# Patient Record
Sex: Female | Born: 1965 | Race: Black or African American | Hispanic: No | Marital: Single | Smoking: Never smoker
Health system: Southern US, Community
[De-identification: ages and names within clinical notes are randomized; demographics above are authoritative.]

## PROBLEM LIST (undated history)

## (undated) DIAGNOSIS — IMO0002 Reserved for concepts with insufficient information to code with codable children: Secondary | ICD-10-CM

## (undated) DIAGNOSIS — R42 Dizziness and giddiness: Secondary | ICD-10-CM

## (undated) DIAGNOSIS — I10 Essential (primary) hypertension: Secondary | ICD-10-CM

## (undated) DIAGNOSIS — J45909 Unspecified asthma, uncomplicated: Secondary | ICD-10-CM

## (undated) HISTORY — PX: ABDOMINAL HYSTERECTOMY: SHX81

## (undated) HISTORY — DX: Essential (primary) hypertension: I10

## (undated) HISTORY — PX: APPENDECTOMY: SHX54

## (undated) HISTORY — PX: CHOLECYSTECTOMY: SHX55

---

## 1999-08-12 ENCOUNTER — Emergency Department (HOSPITAL_COMMUNITY): Admission: EM | Admit: 1999-08-12 | Discharge: 1999-08-12 | Payer: Self-pay | Admitting: Emergency Medicine

## 2000-05-24 ENCOUNTER — Emergency Department (HOSPITAL_COMMUNITY): Admission: EM | Admit: 2000-05-24 | Discharge: 2000-05-24 | Payer: Self-pay | Admitting: Internal Medicine

## 2000-05-24 ENCOUNTER — Encounter: Payer: Self-pay | Admitting: Internal Medicine

## 2001-02-06 ENCOUNTER — Emergency Department (HOSPITAL_COMMUNITY): Admission: EM | Admit: 2001-02-06 | Discharge: 2001-02-06 | Payer: Self-pay | Admitting: Emergency Medicine

## 2001-09-17 ENCOUNTER — Emergency Department (HOSPITAL_COMMUNITY): Admission: EM | Admit: 2001-09-17 | Discharge: 2001-09-17 | Payer: Self-pay | Admitting: Emergency Medicine

## 2001-09-17 ENCOUNTER — Encounter: Payer: Self-pay | Admitting: Emergency Medicine

## 2006-05-12 ENCOUNTER — Emergency Department (HOSPITAL_COMMUNITY): Admission: EM | Admit: 2006-05-12 | Discharge: 2006-05-12 | Payer: Self-pay | Admitting: Emergency Medicine

## 2007-06-24 ENCOUNTER — Emergency Department (HOSPITAL_COMMUNITY): Admission: EM | Admit: 2007-06-24 | Discharge: 2007-06-25 | Payer: Self-pay | Admitting: Emergency Medicine

## 2009-04-26 ENCOUNTER — Emergency Department (HOSPITAL_BASED_OUTPATIENT_CLINIC_OR_DEPARTMENT_OTHER): Admission: EM | Admit: 2009-04-26 | Discharge: 2009-04-26 | Payer: Self-pay | Admitting: Emergency Medicine

## 2009-09-05 ENCOUNTER — Emergency Department (HOSPITAL_BASED_OUTPATIENT_CLINIC_OR_DEPARTMENT_OTHER): Admission: EM | Admit: 2009-09-05 | Discharge: 2009-09-06 | Payer: Self-pay | Admitting: Emergency Medicine

## 2009-09-05 ENCOUNTER — Ambulatory Visit: Payer: Self-pay | Admitting: Diagnostic Radiology

## 2009-10-07 ENCOUNTER — Ambulatory Visit: Payer: Self-pay | Admitting: Diagnostic Radiology

## 2009-10-07 ENCOUNTER — Emergency Department (HOSPITAL_BASED_OUTPATIENT_CLINIC_OR_DEPARTMENT_OTHER): Admission: EM | Admit: 2009-10-07 | Discharge: 2009-10-07 | Payer: Self-pay | Admitting: Emergency Medicine

## 2009-12-22 ENCOUNTER — Ambulatory Visit: Payer: Self-pay | Admitting: Diagnostic Radiology

## 2009-12-22 ENCOUNTER — Emergency Department (HOSPITAL_BASED_OUTPATIENT_CLINIC_OR_DEPARTMENT_OTHER): Admission: EM | Admit: 2009-12-22 | Discharge: 2009-12-22 | Payer: Self-pay | Admitting: Emergency Medicine

## 2010-05-01 ENCOUNTER — Ambulatory Visit: Payer: Self-pay | Admitting: Radiology

## 2010-05-01 ENCOUNTER — Emergency Department (HOSPITAL_BASED_OUTPATIENT_CLINIC_OR_DEPARTMENT_OTHER): Admission: EM | Admit: 2010-05-01 | Discharge: 2010-05-01 | Payer: Self-pay | Admitting: Emergency Medicine

## 2010-07-29 ENCOUNTER — Emergency Department (HOSPITAL_BASED_OUTPATIENT_CLINIC_OR_DEPARTMENT_OTHER): Admission: EM | Admit: 2010-07-29 | Discharge: 2010-07-29 | Payer: Self-pay | Admitting: Emergency Medicine

## 2010-09-03 ENCOUNTER — Ambulatory Visit: Payer: Self-pay | Admitting: Interventional Radiology

## 2010-09-03 ENCOUNTER — Ambulatory Visit: Payer: Self-pay | Admitting: Cardiovascular Disease

## 2010-09-03 ENCOUNTER — Encounter: Payer: Self-pay | Admitting: Emergency Medicine

## 2010-09-03 ENCOUNTER — Observation Stay (HOSPITAL_COMMUNITY): Admission: AD | Admit: 2010-09-03 | Discharge: 2010-09-05 | Payer: Self-pay | Admitting: Internal Medicine

## 2010-09-05 ENCOUNTER — Encounter (INDEPENDENT_AMBULATORY_CARE_PROVIDER_SITE_OTHER): Payer: Self-pay | Admitting: Internal Medicine

## 2010-09-09 ENCOUNTER — Encounter: Admission: RE | Admit: 2010-09-09 | Discharge: 2010-09-27 | Payer: Self-pay | Admitting: Internal Medicine

## 2010-09-14 ENCOUNTER — Telehealth (INDEPENDENT_AMBULATORY_CARE_PROVIDER_SITE_OTHER): Payer: Self-pay | Admitting: *Deleted

## 2010-09-15 ENCOUNTER — Encounter (HOSPITAL_COMMUNITY)
Admission: RE | Admit: 2010-09-15 | Discharge: 2010-11-13 | Payer: Self-pay | Source: Home / Self Care | Attending: Cardiovascular Disease | Admitting: Cardiovascular Disease

## 2010-09-15 ENCOUNTER — Ambulatory Visit: Payer: Self-pay | Admitting: Cardiology

## 2010-09-15 ENCOUNTER — Ambulatory Visit: Payer: Self-pay

## 2010-09-15 ENCOUNTER — Encounter: Payer: Self-pay | Admitting: Cardiology

## 2010-10-09 ENCOUNTER — Emergency Department (HOSPITAL_BASED_OUTPATIENT_CLINIC_OR_DEPARTMENT_OTHER): Admission: EM | Admit: 2010-10-09 | Discharge: 2010-10-09 | Payer: Self-pay | Admitting: Emergency Medicine

## 2010-11-13 ENCOUNTER — Emergency Department (HOSPITAL_BASED_OUTPATIENT_CLINIC_OR_DEPARTMENT_OTHER)
Admission: EM | Admit: 2010-11-13 | Discharge: 2010-11-13 | Payer: Self-pay | Source: Home / Self Care | Admitting: Emergency Medicine

## 2010-12-03 ENCOUNTER — Emergency Department (HOSPITAL_BASED_OUTPATIENT_CLINIC_OR_DEPARTMENT_OTHER)
Admission: EM | Admit: 2010-12-03 | Discharge: 2010-12-03 | Payer: Self-pay | Source: Home / Self Care | Admitting: Emergency Medicine

## 2010-12-14 NOTE — Assessment & Plan Note (Signed)
Summary: Cardiology Nuclear Testing  Nuclear Med Background Indications for Stress Test: Evaluation for Ischemia, Post Hospital  Indications Comments: 09/03/10 [facility] CP/Dizziness (-) enzymes  History: Echo  History Comments: 09/05/10 ECHO EF 65-70%   Symptoms: Chest Pain, Dizziness    Nuclear Pre-Procedure Cardiac Risk Factors: Hypertension Caffeine/Decaff Intake: None NPO After: 10:00 PM Lungs: clear IV 0.9% NS with Angio Cath: 24g     IV Site: R Hand IV Started by: Bonnita Levan, RN Chest Size (in) 44     Cup Size DD     Height (in): 62 Weight (lb): 187 BMI: 34.33  Nuclear Med Study 1 or 2 day study:  1 day     Stress Test Type:  Stress Reading MD:  Marca Ancona, MD     Referring MD:  C.McAlhany Resting Radionuclide:  Technetium 50m Tetrofosmin     Resting Radionuclide Dose:  10.8 mCi  Stress Radionuclide:  Technetium 75m Tetrofosmin     Stress Radionuclide Dose:  33 mCi   Stress Protocol Exercise Time (min):  8:46 min     Max HR:  146 bpm     Predicted Max HR:  175 bpm  Max Systolic BP: 185 mm Hg     Percent Max HR:  83.43 %     METS: 10.00 Rate Pressure Product:  60454    Stress Test Technologist:  Milana Na, EMT-P     Nuclear Technologist:  Doyne Keel, CNMT  Rest Procedure  Myocardial perfusion imaging was performed at rest 45 minutes following the intravenous administration of Technetium 27m Tetrofosmin.  Stress Procedure  The patient exercised for 8:46. The patient stopped due to fatigue and denied any chest pain.  There were no significant ST-T wave changes.  Technetium 84m Tetrofosmin was injected at peak exercise and myocardial perfusion imaging was performed after a brief delay.  QPS Raw Data Images:  Normal; no motion artifact; normal heart/lung ratio. Stress Images:  Normal homogeneous uptake in all areas of the myocardium. Rest Images:  Normal homogeneous uptake in all areas of the myocardium. Subtraction (SDS):  There is no evidence of scar or  ischemia. Transient Ischemic Dilatation:  .82  (Normal <1.22)  Lung/Heart Ratio:  .33  (Normal <0.45)  Quantitative Gated Spect Images QGS EDV:  56 ml QGS ESV:  13 ml QGS EF:  76 % QGS cine images:  Normal wall motion.    Overall Impression  Exercise Capacity: Fair exercise capacity. BP Response: Normal blood pressure response. Clinical Symptoms: No chest pain, stopped due to fatigue.  ECG Impression: No significant ST segment change suggestive of ischemia. Overall Impression: Normal stress nuclear study.  Appended Document: Cardiology Nuclear Testing Normal stress. Can we call her? thanks, cdm  Appended Document: Cardiology Nuclear Testing Pt. aware of normal stress test.

## 2010-12-14 NOTE — Progress Notes (Signed)
Summary: Nuclear Pre-Procedure  Phone Note Outgoing Call   Call placed by: Milana Na, EMT-P,  September 14, 2010 3:09 PM Summary of Call: Left message with information on [employer] Information Sheet (see scanned document for details).      Nuclear Med Background Indications for Stress Test: Evaluation for Ischemia, Post Hospital  Indications Comments: 09/03/10 [facility] CP/Dizziness (-) enzymes  History: Echo  History Comments: 09/05/10 ECHO EF 65-70%   Symptoms: Chest Pain, Dizziness    Nuclear Pre-Procedure Cardiac Risk Factors: Hypertension  Nuclear Med Study Referring MD:  C.McAlhany

## 2010-12-23 ENCOUNTER — Emergency Department (INDEPENDENT_AMBULATORY_CARE_PROVIDER_SITE_OTHER): Payer: BC Managed Care – PPO

## 2010-12-23 ENCOUNTER — Emergency Department (HOSPITAL_BASED_OUTPATIENT_CLINIC_OR_DEPARTMENT_OTHER)
Admission: EM | Admit: 2010-12-23 | Discharge: 2010-12-23 | Disposition: A | Discharge status: Home / Self Care | Payer: BC Managed Care – PPO | Attending: Emergency Medicine | Admitting: Emergency Medicine

## 2010-12-23 DIAGNOSIS — IMO0002 Reserved for concepts with insufficient information to code with codable children: Secondary | ICD-10-CM | POA: Insufficient documentation

## 2010-12-23 DIAGNOSIS — M25529 Pain in unspecified elbow: Secondary | ICD-10-CM

## 2010-12-23 DIAGNOSIS — G8929 Other chronic pain: Secondary | ICD-10-CM | POA: Insufficient documentation

## 2010-12-23 DIAGNOSIS — M25429 Effusion, unspecified elbow: Secondary | ICD-10-CM | POA: Insufficient documentation

## 2010-12-23 DIAGNOSIS — I10 Essential (primary) hypertension: Secondary | ICD-10-CM | POA: Insufficient documentation

## 2010-12-29 ENCOUNTER — Ambulatory Visit (INDEPENDENT_AMBULATORY_CARE_PROVIDER_SITE_OTHER): Payer: BC Managed Care – PPO | Admitting: Family Medicine

## 2010-12-29 ENCOUNTER — Encounter: Payer: Self-pay | Admitting: Family Medicine

## 2010-12-29 DIAGNOSIS — M549 Dorsalgia, unspecified: Secondary | ICD-10-CM | POA: Insufficient documentation

## 2010-12-29 DIAGNOSIS — M25529 Pain in unspecified elbow: Secondary | ICD-10-CM

## 2010-12-29 DIAGNOSIS — G43909 Migraine, unspecified, not intractable, without status migrainosus: Secondary | ICD-10-CM | POA: Insufficient documentation

## 2010-12-29 DIAGNOSIS — I10 Essential (primary) hypertension: Secondary | ICD-10-CM | POA: Insufficient documentation

## 2011-01-03 ENCOUNTER — Other Ambulatory Visit: Payer: Self-pay | Admitting: Family Medicine

## 2011-01-03 ENCOUNTER — Ambulatory Visit (INDEPENDENT_AMBULATORY_CARE_PROVIDER_SITE_OTHER): Payer: BC Managed Care – PPO | Admitting: Family Medicine

## 2011-01-03 ENCOUNTER — Ambulatory Visit (INDEPENDENT_AMBULATORY_CARE_PROVIDER_SITE_OTHER)
Admission: RE | Admit: 2011-01-03 | Discharge: 2011-01-03 | Disposition: A | Discharge status: Home / Self Care | Payer: BC Managed Care – PPO | Source: Ambulatory Visit | Attending: Family Medicine | Admitting: Family Medicine

## 2011-01-03 ENCOUNTER — Encounter: Payer: Self-pay | Admitting: Family Medicine

## 2011-01-03 ENCOUNTER — Ambulatory Visit (HOSPITAL_BASED_OUTPATIENT_CLINIC_OR_DEPARTMENT_OTHER)
Admission: RE | Admit: 2011-01-03 | Discharge: 2011-01-03 | Disposition: A | Discharge status: Home / Self Care | Payer: BC Managed Care – PPO | Source: Ambulatory Visit | Attending: Family Medicine | Admitting: Family Medicine

## 2011-01-03 DIAGNOSIS — M25521 Pain in right elbow: Secondary | ICD-10-CM

## 2011-01-03 DIAGNOSIS — M25529 Pain in unspecified elbow: Secondary | ICD-10-CM

## 2011-01-03 DIAGNOSIS — M25429 Effusion, unspecified elbow: Secondary | ICD-10-CM | POA: Insufficient documentation

## 2011-01-03 DIAGNOSIS — R52 Pain, unspecified: Secondary | ICD-10-CM

## 2011-01-05 ENCOUNTER — Ambulatory Visit (HOSPITAL_BASED_OUTPATIENT_CLINIC_OR_DEPARTMENT_OTHER)
Admission: RE | Admit: 2011-01-05 | Discharge: 2011-01-05 | Disposition: A | Discharge status: Home / Self Care | Payer: BC Managed Care – PPO | Source: Ambulatory Visit | Attending: Family Medicine | Admitting: Family Medicine

## 2011-01-05 DIAGNOSIS — R52 Pain, unspecified: Secondary | ICD-10-CM

## 2011-01-05 NOTE — Assessment & Plan Note (Signed)
Summary: ED F/U,NP,RT ARM/ELBOW FRACTURE,MC   Vital Signs:  Patient profile:   45 year old female Height:      62 inches (157.48 cm) Weight:      191.0 pounds (86.82 kg) BMI:     35.06 Temp:     98.0 degrees F (36.67 degrees C) oral Pulse rate:   93 / minute BP sitting:   128 / 85  (left arm)  Vitals Entered By: Baxter Hire) (December 29, 2010 10:49 AM) CC: ED f/u, right arm/elbow fracture Pain Assessment Patient in pain? yes     Location: rt. arm Intensity: 8 Onset of pain  Pain x 6 days Nutritional Status BMI of > 30 = obese  Does patient need assistance? Functional Status Self care Ambulation Normal   CC:  ED f/u and right arm/elbow fracture.  History of Present Illness: Patient here for ED f/u of right elbow probable fracture, effusion 6 days after initial x-rays.  Reviewed x-rays and shown to patient - discussed typically like to repeat x-rays at 10 days and no earlier than 1 week - posterior splint replaced on patient and wrapped up - she will come back on monday, remove splint and repeat x-rays of elbow to reassess for location of fracture.  out of work in the meantime - did not need refill on pain medication.  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

## 2011-01-10 ENCOUNTER — Ambulatory Visit: Payer: BC Managed Care – PPO | Attending: Family Medicine | Admitting: Physical Therapy

## 2011-01-11 NOTE — Assessment & Plan Note (Signed)
Summary: FOLLOW RIGHT ARM   Vital Signs:  Patient profile:   45 year old female Height:      62 inches (157.48 cm) Weight:      193.2 pounds (87.82 kg) BMI:     35.46 Temp:     98.1 degrees F (36.72 degrees C) oral Pulse (ortho):   111 / minute BP sitting:   127 / 85  (left arm)  Vitals Entered By: Baxter Hire) (January 03, 2011 10:19 AM) CC: follow-up visit / right arm Pain Assessment Patient in pain? yes     Location: right arm Intensity: 9 Nutritional Status BMI of > 30 = obese  Does patient need assistance? Functional Status Self care Ambulation Normal   CC:  follow-up visit / right arm.  History of Present Illness: Patient returns today for f/u R elbow effusion, possible fracture  Initial injury 10 days ago when hit right elbow on something at nursing facility. Over next 1-2 days, severe stiffness and pain with any motions of right wrist and elbow. Right hand dominant. Has been out of work since. X-rays in ED done showed effusion but no obvious fracture. Has been in posterior splint and here for repeat x-rays to assess for fracture. Using sling as well. Has pain medication - taking this as needed. No other complaints.  Habits & Providers  Alcohol-Tobacco-Diet     Alcohol drinks/day: 0     Tobacco Status: never  Problems Prior to Update: 1)  Essential Hypertension, Benign  (ICD-401.1) 2)  Migraine Headache  (ICD-346.90) 3)  Back Pain  (ICD-724.5) 4)  Elbow Pain, Right  (ICD-719.42)  Medications Prior to Update: 1)  Topamax 25 Mg Tabs (Topiramate) 2)  Lisinopril-Hydrochlorothiazide 20-12.5 Mg Tabs (Lisinopril-Hydrochlorothiazide) 3)  Flexeril 10 Mg Tabs (Cyclobenzaprine Hcl) 4)  Percocet 5-325 Mg Tabs (Oxycodone-Acetaminophen)  Allergies (verified): No Known Drug Allergies  Family History: Reviewed history from 12/29/2010 and no changes required. + DM and HTN in father, HTN in mother negative for heart disease  Social History: Reviewed  history from 12/29/2010 and no changes required. 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 TTP radial aspect of elbow within extensor mass.  No focal bony TTP at olecranon, radial head but diffuse posterior TTP. Pain with extension - lacks 15 degrees extension.  Full flexion.  Pain with supination as well. Collateral ligaments intact with valgus/varus stressing.  No reproduction of pain with these. NVI distally with 2+ radial pulse.   Impression & Recommendations:  Problem # 1:  ELBOW PAIN, RIGHT (ICD-719.42) Assessment Unchanged Repeat x-rays with improvement in elbow effusion but still not able to locate a fracture.  Therefore proceeded with CT scan to further assess given her limited ROM and pain today but lack of evidence of septic joint - CT without evidence of fracture as well.  Dx with elbow sprain vs synovitis.  Discontinue posterior splint.  Start PT to work on ROM and strengthening with hopes that she will be able to return to work over next few weeks.  NSAIDs regularly for pain, inflammation and swelling.  Icing but advised not to place over the area of ulnar nerve about elbow.  Elevation, ACE wrap as needed.  F/u in 2 -3 weeks for reevaluation and call with any questions in meantime.  Orders: Diagnostic X-Ray/Fluoroscopy (Diagnostic X-Ray/Flu) CT  without Contrast (CT w/o contrast)  Complete Medication List: 1)  Topamax 25 Mg Tabs (Topiramate) 2)  Lisinopril-hydrochlorothiazide 20-12.5 Mg Tabs (Lisinopril-hydrochlorothiazide) 3)  Flexeril 10 Mg Tabs (Cyclobenzaprine hcl) 4)  Percocet 5-325 Mg Tabs (Oxycodone-acetaminophen)   Orders Added: 1)  Diagnostic X-Ray/Fluoroscopy [Diagnostic X-Ray/Flu] 2)  CT without Contrast [CT w/o contrast] 3)   Est. Patient Level III [81191]

## 2011-01-17 ENCOUNTER — Encounter: Payer: Self-pay | Admitting: Family Medicine

## 2011-01-17 ENCOUNTER — Ambulatory Visit (INDEPENDENT_AMBULATORY_CARE_PROVIDER_SITE_OTHER): Payer: BC Managed Care – PPO | Admitting: Family Medicine

## 2011-01-17 DIAGNOSIS — M25529 Pain in unspecified elbow: Secondary | ICD-10-CM

## 2011-01-19 ENCOUNTER — Ambulatory Visit: Payer: BC Managed Care – PPO | Attending: Family Medicine | Admitting: Physical Therapy

## 2011-01-19 DIAGNOSIS — R42 Dizziness and giddiness: Secondary | ICD-10-CM | POA: Insufficient documentation

## 2011-01-19 DIAGNOSIS — IMO0001 Reserved for inherently not codable concepts without codable children: Secondary | ICD-10-CM | POA: Insufficient documentation

## 2011-01-19 DIAGNOSIS — R269 Unspecified abnormalities of gait and mobility: Secondary | ICD-10-CM | POA: Insufficient documentation

## 2011-01-25 LAB — URINALYSIS, ROUTINE W REFLEX MICROSCOPIC
Glucose, UA: NEGATIVE mg/dL
Leukocytes, UA: NEGATIVE
Urobilinogen, UA: 1 mg/dL (ref 0.0–1.0)
pH: 7.5 (ref 5.0–8.0)

## 2011-01-25 NOTE — Assessment & Plan Note (Signed)
Summary: F/U/LP   Vital Signs:  Patient profile:   45 year old female Height:      62 inches (157.48 cm) Weight:      194.2 pounds (88.27 kg) BMI:     35.65 Temp:     98.2 degrees F (36.78 degrees C) oral Pulse rate:   102 / minute BP sitting:   132 / 85  (left arm)  Vitals Entered By: Baxter Hire) (January 17, 2011 9:05 AM) CC: follow-up visit / right shoulder Pain Assessment Patient in pain? yes     Location: shoulder Intensity: 8 Onset of pain  pain worse this AM (01-17-11) Nutritional Status BMI of > 30 = obese  Does patient need assistance? Functional Status Self care Ambulation Normal   CC:  follow-up visit / right shoulder.  History of Present Illness: Patient returns today for f/u R elbow effusion, possible fracture  Initial injury 3 1/2 weeks ago when hit right elbow on something at nursing facility. Over next 1-2 days, severe stiffness and pain with any motions of right wrist and elbow. Right hand dominant. Has been out of work since. X-rays in ED done showed effusion but no obvious fracture. Has been in posterior splint and here for repeat x-rays to assess for fracture. At last OV repeat x-rays did not show fracture so CT done - no evidence of occult fracture. Was enrolled in PT but missed first appointment - due to go for first time this Wednesday. Using sling when out of house she says - otherwise leaving this off Heat, celebrex help. Some improvement but still stiff. Has pain medication - taking this as needed. No other complaints.  Habits & Providers  Alcohol-Tobacco-Diet     Alcohol drinks/day: 0     Tobacco Status: never  Current Problems (verified): 1)  Essential Hypertension, Benign  (ICD-401.1) 2)  Migraine Headache  (ICD-346.90) 3)  Back Pain  (ICD-724.5) 4)  Elbow Pain, Right  (ICD-719.42)  Current Medications (verified): 1)  Topamax 25 Mg Tabs (Topiramate) 2)  Lisinopril-Hydrochlorothiazide 20-12.5 Mg Tabs  (Lisinopril-Hydrochlorothiazide) 3)  Flexeril 10 Mg Tabs (Cyclobenzaprine Hcl) 4)  Percocet 5-325 Mg Tabs (Oxycodone-Acetaminophen)  Allergies (verified): No Known Drug Allergies  Social History: Reviewed history from 12/29/2010 and no changes required. 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 TTP Throughout radial and ulnar aspects of elbow within musculature.  No focal bony TTP at olecranon, radial head but diffuse posterior TTP. Pain with extension - lacks only 5 degrees extension now.  Full flexion.  Pain with supination as well. Collateral ligaments intact with valgus/varus stressing.  No reproduction of pain with these. NVI distally with 2+ radial pulse.   Impression & Recommendations:  Problem # 1:  ELBOW PAIN, RIGHT (ICD-719.42) Assessment Improved Some improvement in ROM and pain but she has not started PT as we had ordered last visit 2 weeks ago - she missed her first visit.  Stressed the importance of this and she must not miss another visit.  Otherwise she will not improve.  Reviewed x-ray and CT results again not showing a fracture - persistent pain and soreness now related to stiffness, muscle soreness.  Imperative she work on home exercise program as directed by PT as well.  Discontinue sling - discussed how this contributes to soreness and stiffness.  Continue heat, celebrex.  Complete Medication List: 1)  Topamax 25 Mg Tabs (Topiramate) 2)  Lisinopril-hydrochlorothiazide 20-12.5 Mg Tabs (Lisinopril-hydrochlorothiazide) 3)  Flexeril 10 Mg Tabs (Cyclobenzaprine hcl) 4)  Percocet 5-325 Mg Tabs (Oxycodone-acetaminophen)   Orders Added: 1)  Est. Patient Level II [16109]

## 2011-01-26 ENCOUNTER — Ambulatory Visit: Payer: BC Managed Care – PPO | Admitting: Physical Therapy

## 2011-01-26 LAB — DIFFERENTIAL
Basophils Relative: 1 % (ref 0–1)
Eosinophils Absolute: 0.1 10*3/uL (ref 0.0–0.7)
Lymphocytes Relative: 43 % (ref 12–46)
Lymphs Abs: 1.9 10*3/uL (ref 0.7–4.0)
Neutro Abs: 2.1 10*3/uL (ref 1.7–7.7)
Neutrophils Relative %: 48 % (ref 43–77)

## 2011-01-26 LAB — URINALYSIS, ROUTINE W REFLEX MICROSCOPIC
Bilirubin Urine: NEGATIVE
Glucose, UA: NEGATIVE mg/dL
Ketones, ur: NEGATIVE mg/dL
Nitrite: NEGATIVE
Urobilinogen, UA: 0.2 mg/dL (ref 0.0–1.0)
pH: 7.5 (ref 5.0–8.0)

## 2011-01-26 LAB — CBC
Hemoglobin: 12.7 g/dL (ref 12.0–15.0)
MCH: 29.9 pg (ref 26.0–34.0)
MCHC: 33.3 g/dL (ref 30.0–36.0)
MCV: 89.8 fL (ref 78.0–100.0)
RDW: 13.1 % (ref 11.5–15.5)
WBC: 4.4 10*3/uL (ref 4.0–10.5)

## 2011-01-26 LAB — CARDIAC PANEL(CRET KIN+CKTOT+MB+TROPI)
CK, MB: 1.7 ng/mL (ref 0.3–4.0)
Relative Index: 0.8 (ref 0.0–2.5)
Relative Index: 0.9 (ref 0.0–2.5)
Total CK: 164 U/L (ref 7–177)
Troponin I: 0.02 ng/mL (ref 0.00–0.06)
Troponin I: 0.02 ng/mL (ref 0.00–0.06)

## 2011-01-26 LAB — POCT CARDIAC MARKERS
CKMB, poc: <1 ng/mL — ABNORMAL LOW (ref 1.0–8.0)
Myoglobin, poc: 51.3 ng/mL (ref 12–200)
Troponin i, poc: <0.05 ng/mL (ref 0.00–0.09)

## 2011-01-26 LAB — HEMOGLOBIN A1C: Hgb A1c MFr Bld: 6 % — ABNORMAL HIGH (ref <5.7)

## 2011-01-26 LAB — LIPID PANEL
Cholesterol: 161 mg/dL (ref 0–200)
HDL: 48 mg/dL (ref >39)

## 2011-01-26 LAB — BASIC METABOLIC PANEL
CO2: 24 mEq/L (ref 19–32)
Chloride: 109 mEq/L (ref 96–112)

## 2011-01-30 LAB — COMPREHENSIVE METABOLIC PANEL
Alkaline Phosphatase: 137 U/L — ABNORMAL HIGH (ref 39–117)
GFR calc non Af Amer: >60 mL/min (ref >60)
Potassium: 3.6 mEq/L (ref 3.5–5.1)
Total Protein: 7.7 g/dL (ref 6.0–8.3)

## 2011-01-30 LAB — DIFFERENTIAL
Basophils Absolute: 0 10*3/uL (ref 0.0–0.1)
Basophils Relative: 1 % (ref 0–1)
Eosinophils Relative: 1 % (ref 0–5)
Lymphocytes Relative: 47 % — ABNORMAL HIGH (ref 12–46)
Monocytes Absolute: 0.6 10*3/uL (ref 0.1–1.0)
Neutro Abs: 2.9 10*3/uL (ref 1.7–7.7)
Neutrophils Relative %: 43 % (ref 43–77)

## 2011-01-30 LAB — URINALYSIS, ROUTINE W REFLEX MICROSCOPIC
Glucose, UA: NEGATIVE mg/dL
Ketones, ur: NEGATIVE mg/dL
Nitrite: NEGATIVE
Protein, ur: NEGATIVE mg/dL
Specific Gravity, Urine: 1.028 (ref 1.005–1.030)

## 2011-01-30 LAB — CBC
Hemoglobin: 12.7 g/dL (ref 12.0–15.0)
RBC: 4.38 MIL/uL (ref 3.87–5.11)
WBC: 6.8 10*3/uL (ref 4.0–10.5)

## 2011-01-30 LAB — URINE CULTURE: Culture: NO GROWTH

## 2011-01-31 ENCOUNTER — Ambulatory Visit (INDEPENDENT_AMBULATORY_CARE_PROVIDER_SITE_OTHER): Payer: BC Managed Care – PPO | Admitting: Family Medicine

## 2011-01-31 ENCOUNTER — Encounter: Payer: Self-pay | Admitting: Family Medicine

## 2011-01-31 DIAGNOSIS — M25529 Pain in unspecified elbow: Secondary | ICD-10-CM

## 2011-02-01 ENCOUNTER — Ambulatory Visit: Payer: BC Managed Care – PPO | Admitting: Physical Therapy

## 2011-02-03 ENCOUNTER — Encounter: Payer: BC Managed Care – PPO | Admitting: Physical Therapy

## 2011-02-10 NOTE — Letter (Signed)
Summary: Generic Letter  [facility]  [address]   [place], [place] [address]   Phone: [phone]  Fax:     01/31/2011  Veronica Braun [address] [place], [place]  [address]  [place]  To Whom It May Concern:  [name] may return to work on 3/26 with the following restrictions:  No lifting more than 15 pounds.    We will see her back in the clinic in 2 weeks for a reassessment of her clinical progress.  She will continue physical therapy in the meantime.   Sincerely,   Norton Blizzard MD

## 2011-02-10 NOTE — Assessment & Plan Note (Signed)
Summary: F/U/LP   Vital Signs:  Patient profile:   45 year old female Height:      62 inches (157.48 cm) Weight:      191.2 pounds (86.91 kg) BMI:     35.10 Temp:     98.6 degrees F (37.00 degrees C) oral Pulse rate:   101 / minute BP sitting:   135 / 89  (left arm)  Vitals Entered By: Baxter Hire) (January 31, 2011 9:31 AM) CC: follow-up visit Pain Assessment Patient in pain? yes     Location: right arm Intensity: 5 Nutritional Status BMI of > 30 = obese  Does patient need assistance? Functional Status Self care Ambulation Normal   CC:  follow-up visit.  History of Present Illness: Patient returns today for 2 wk f/u R elbow effusion, possible fracture  Initial injury 5 1/2 weeks ago when hit right elbow on something at nursing facility. Over next 1-2 days, severe stiffness and pain with any motions of right wrist and elbow. Right hand dominant. Has been out of work since. X-rays in ED done showed effusion but no obvious fracture. Had been in posterior splint and here for repeat x-rays to assess for fracture. Repeat x-rays and CT done - no evidence of occult fracture. Was enrolled in PT which she has been to over past 2 weeks with improvement. Stopped using sling 2 weeks ago. Heat, celebrex help. Has pain medication - taking this as needed. No other complaints. Has been placed on as needed status at work per her report.  Habits & Providers  Alcohol-Tobacco-Diet     Alcohol drinks/day: 0     Tobacco Status: never  Current Problems (verified): 1)  Essential Hypertension, Benign  (ICD-401.1) 2)  Migraine Headache  (ICD-346.90) 3)  Back Pain  (ICD-724.5) 4)  Elbow Pain, Right  (ICD-719.42)  Current Medications (verified): 1)  Topamax 25 Mg Tabs (Topiramate) 2)  Lisinopril-Hydrochlorothiazide 20-12.5 Mg Tabs (Lisinopril-Hydrochlorothiazide) 3)  Flexeril 10 Mg Tabs (Cyclobenzaprine Hcl) 4)  Percocet 5-325 Mg Tabs (Oxycodone-Acetaminophen)  Allergies  (verified): No Known Drug Allergies  Physical Exam  General:  Well-developed,well-nourished,in no acute distress; alert,appropriate and cooperative throughout examination Msk:  R elbow: No obvious deformity, swelling or bruising. No focal TTP. FROM without pain including supination/pronation though reports still having pain within elbow joint. Collateral ligaments intact with valgus/varus stressing.  No reproduction of pain with these. NVI distally with 2+ radial pulse. Strength 4+/5 with resisted flexion/extension of elbow.   Impression & Recommendations:  Problem # 1:  ELBOW PAIN, RIGHT (ICD-719.42) Assessment Improved Patient back to FROM but still with strength deficits.  Continue with PT and return to work next [employer] with only restriction being no lifting > 15 pounds.  Will see her in office 1 week later and anticipate full release at that time.  Exam much improved today.  Complete Medication List: 1)  Topamax 25 Mg Tabs (Topiramate) 2)  Lisinopril-hydrochlorothiazide 20-12.5 Mg Tabs (Lisinopril-hydrochlorothiazide) 3)  Flexeril 10 Mg Tabs (Cyclobenzaprine hcl) 4)  Percocet 5-325 Mg Tabs (Oxycodone-acetaminophen)  Patient Instructions: 1)  Continue with physical therapy - you are making excellent progress. 2)  No work this week but start next week with only restriction being no lifting more than 15 pounds.  If they don't have a job to accomodate this you will have to continue to be out. 3)  Follow up with [place] in 2 weeks. 4)  Anticipate complete return without restrictions at this time.   Orders Added: 1)  Est.  Patient Level III [id number]

## 2011-02-14 ENCOUNTER — Ambulatory Visit (INDEPENDENT_AMBULATORY_CARE_PROVIDER_SITE_OTHER): Payer: Self-pay | Admitting: Family Medicine

## 2011-02-14 ENCOUNTER — Encounter: Payer: Self-pay | Admitting: Family Medicine

## 2011-02-14 VITALS — BP 122/83 | HR 111 | Temp 98.3°F | Ht 63.0 in | Wt 190.0 lb

## 2011-02-14 DIAGNOSIS — M25529 Pain in unspecified elbow: Secondary | ICD-10-CM

## 2011-02-14 NOTE — Progress Notes (Signed)
  Subjective:    Patient ID: Veronica Braun, female    DOB: September 16, 1966, 45 y.o.   MRN: [id number]  HPI  Patient returns today for f/u R elbow pain, swelling from direct blow sustained on 2/9 at work. Initial injury when hit right elbow on something at nursing facility.  Over next 1-2 days, severe stiffness and pain with any motions of right wrist and elbow.  Right hand dominant.  Had been out of work since then placed in prn status per her report. X-rays in ED done showed effusion but no obvious fracture.  Had been in posterior splint and here for repeat x-rays to assess for fracture.  Repeat x-rays and CT done - no evidence of occult fracture.  Was enrolled in PT - she reports having only been to 1 session total - 2 deaths in family and birth of grandchild per her report have led her to cancel appointments. Has pain medication - taking this as needed though not currently in pain. No other complaints.   Past Medical History  Diagnosis Date  . Hypertension   . Migraine     No current outpatient prescriptions on file prior to visit.    No past surgical history on file.  No Known Allergies  History   Social History  . Marital Status: Single    Spouse Name: N/A    Number of Children: N/A  . Years of Education: N/A   Occupational History  . Not on file.   Social History Main Topics  . Smoking status: Never Smoker   . Smokeless tobacco: Not on file  . Alcohol Use: Not on file  . Drug Use: Not on file  . Sexually Active: Not on file   Other Topics Concern  . Not on file   Social History Narrative  . No narrative on file    Family History  Problem Relation Age of Onset  . Hypertension Mother   . Diabetes Father   . Hypertension Father   . Heart attack Neg Hx     BP 122/83  Pulse 111  Temp(Src) 98.3 F (36.8 C) (Oral)  Ht 5\' 3"  (1.6 m)  Wt 190 lb (86.183 kg)  BMI 33.66 kg/m2  Review of Systems See HPI above.    Objective:   Physical Exam General:  Well-developed,well-nourished,in no acute distress; alert,appropriate and cooperative throughout examination  Msk: R elbow:  No obvious deformity, swelling or bruising.  No focal TTP.  FROM without pain including supination/pronation.  Collateral ligaments intact with valgus/varus stressing. No reproduction of pain with these.  NVI distally with 2+ radial pulse.  Strength 5-/5 with resisted flexion/extension of elbow.     Assessment & Plan:  1. Right elbow pain.  Clinically healed.  FROM and only mild strength deficit - enough to return to work without any restrictions.  Work note provided.

## 2011-02-14 NOTE — Patient Instructions (Signed)
Return to work without restrictions - note given.

## 2011-02-14 NOTE — Assessment & Plan Note (Signed)
Clinically healed.  FROM and only mild strength deficit - enough to return to work without any restrictions.  Work note provided.

## 2011-02-16 LAB — DIFFERENTIAL
Basophils Relative: 1 % (ref 0–1)
Eosinophils Absolute: 0.1 10*3/uL (ref 0.0–0.7)
Eosinophils Relative: 1 % (ref 0–5)
Lymphs Abs: 3.8 10*3/uL (ref 0.7–4.0)
Neutrophils Relative %: 51 % (ref 43–77)

## 2011-02-16 LAB — CBC
HCT: 41.6 % (ref 36.0–46.0)
MCHC: 33.1 g/dL (ref 30.0–36.0)
MCV: 88.8 fL (ref 78.0–100.0)
Platelets: 257 10*3/uL (ref 150–400)
RDW: 12.8 % (ref 11.5–15.5)
WBC: 9.3 10*3/uL (ref 4.0–10.5)

## 2011-02-16 LAB — BASIC METABOLIC PANEL
BUN: 16 mg/dL (ref 6–23)
Creatinine, Ser: 1.2 mg/dL (ref 0.4–1.2)
Potassium: 3.6 mEq/L (ref 3.5–5.1)

## 2011-02-16 LAB — POCT CARDIAC MARKERS: Troponin i, poc: <0.05 ng/mL (ref 0.00–0.09)

## 2011-02-17 LAB — URINALYSIS, ROUTINE W REFLEX MICROSCOPIC
Ketones, ur: NEGATIVE mg/dL
Nitrite: NEGATIVE
Protein, ur: NEGATIVE mg/dL
pH: 6 (ref 5.0–8.0)

## 2011-02-17 LAB — URINE MICROSCOPIC-ADD ON

## 2011-02-17 LAB — COMPREHENSIVE METABOLIC PANEL
Alkaline Phosphatase: 151 U/L — ABNORMAL HIGH (ref 39–117)
BUN: 12 mg/dL (ref 6–23)
Chloride: 101 mEq/L (ref 96–112)
GFR calc non Af Amer: >60 mL/min (ref >60)
Glucose, Bld: 91 mg/dL (ref 70–99)
Potassium: 3.8 mEq/L (ref 3.5–5.1)
Total Bilirubin: 0.9 mg/dL (ref 0.3–1.2)

## 2011-02-17 LAB — LIPASE, BLOOD: Lipase: 103 U/L (ref 23–300)

## 2011-09-22 ENCOUNTER — Emergency Department (INDEPENDENT_AMBULATORY_CARE_PROVIDER_SITE_OTHER): Payer: Self-pay

## 2011-09-22 ENCOUNTER — Emergency Department (HOSPITAL_BASED_OUTPATIENT_CLINIC_OR_DEPARTMENT_OTHER)
Admission: EM | Admit: 2011-09-22 | Discharge: 2011-09-22 | Disposition: A | Discharge status: Home / Self Care | Payer: Self-pay | Attending: Emergency Medicine | Admitting: Emergency Medicine

## 2011-09-22 ENCOUNTER — Other Ambulatory Visit: Payer: Self-pay

## 2011-09-22 ENCOUNTER — Encounter (HOSPITAL_BASED_OUTPATIENT_CLINIC_OR_DEPARTMENT_OTHER): Payer: Self-pay | Admitting: *Deleted

## 2011-09-22 ENCOUNTER — Emergency Department (HOSPITAL_BASED_OUTPATIENT_CLINIC_OR_DEPARTMENT_OTHER): Payer: Self-pay

## 2011-09-22 DIAGNOSIS — I10 Essential (primary) hypertension: Secondary | ICD-10-CM | POA: Insufficient documentation

## 2011-09-22 DIAGNOSIS — J40 Bronchitis, not specified as acute or chronic: Secondary | ICD-10-CM | POA: Insufficient documentation

## 2011-09-22 DIAGNOSIS — R05 Cough: Secondary | ICD-10-CM

## 2011-09-22 MED ORDER — AZITHROMYCIN 250 MG PO TABS: 250.0000 mg | ORAL_TABLET | Freq: Every day | ORAL | Status: AC

## 2011-09-22 MED ORDER — ALBUTEROL SULFATE HFA 108 (90 BASE) MCG/ACT IN AERS: 2.0000 | INHALATION_SPRAY | RESPIRATORY_TRACT | Status: DC | PRN

## 2011-09-22 NOTE — ED Provider Notes (Signed)
History     CSN: [id number] Arrival date & time: 09/22/2011 11:16 AM   First MD Initiated Contact with Patient 09/22/11 1129      Chief Complaint  Patient presents with  . Cough    (Consider location/radiation/quality/duration/timing/severity/associated sxs/prior treatment) HPI Comments: Patient complains of productive cough and congestion for the past 4 days. She describes her mucous is yellow and green. She states her front of her chest and her back are sore from coughing. She denies any fever, abdominal pain.   She has had sick contacts with her grandchildren., no history of asthma and does not smoke. She's had several episodes of posttussive emesis and pain in her chest from coughing. She denies any emesis not associated with coughing.  The history is provided by the patient.    Past Medical History  Diagnosis Date  . Hypertension   . Migraine     Past Surgical History  Procedure Date  . Cholecystectomy   . Abdominal hysterectomy   . Appendectomy     Family History  Problem Relation Age of Onset  . Hypertension Mother   . Diabetes Father   . Hypertension Father   . Heart attack Neg Hx     History  Substance Use Topics  . Smoking status: Never Smoker   . Smokeless tobacco: Not on file  . Alcohol Use: No    OB History    Grav Para Term Preterm Abortions TAB SAB Ect Mult Living                  Review of Systems  Constitutional: Positive for fatigue. Negative for fever, activity change and appetite change.  HENT: Positive for congestion, sore throat and rhinorrhea. Negative for trouble swallowing.   Eyes: Negative for discharge.  Respiratory: Positive for cough.   Cardiovascular: Positive for chest pain.  Gastrointestinal: Positive for nausea and vomiting. Negative for abdominal pain.  Genitourinary: Positive for hematuria. Negative for dysuria.  Musculoskeletal: Positive for back pain.  Neurological: Negative for weakness and headaches.    Allergies    Review of patient's allergies indicates no known allergies.  Home Medications   Current Outpatient Rx  Name Route Sig Dispense Refill  . ALBUTEROL SULFATE HFA 108 (90 BASE) MCG/ACT IN AERS Inhalation Inhale 2 puffs into the lungs every 4 (four) hours as needed for wheezing. 1 Inhaler 0  . AZITHROMYCIN 250 MG PO TABS Oral Take 1 tablet (250 mg total) by mouth daily. Take first 2 tablets together, then 1 every day until finished. 6 tablet 0    BP 139/83  Pulse 95  Temp(Src) 98.3 F (36.8 C) (Oral)  Resp 20  SpO2 99%  Physical Exam  Constitutional: She is oriented to person, place, and time. She appears well-developed and well-nourished. No distress.  HENT:  Head: Normocephalic and atraumatic.  Right Ear: External ear normal.  Left Ear: External ear normal.  Mouth/Throat: Oropharynx is clear and moist. No oropharyngeal exudate.  Eyes: Conjunctivae are normal. Pupils are equal, round, and reactive to light.  Neck: Normal range of motion. Neck supple.       No meningismus  Cardiovascular: Normal rate, regular rhythm and normal heart sounds.   Pulmonary/Chest: Effort normal and breath sounds normal. No respiratory distress. She has no wheezes. She exhibits tenderness.       Anterior chest wall tenderness  Abdominal: Soft. There is no tenderness. There is no rebound and no guarding.  Musculoskeletal: Normal range of motion. She exhibits no edema and  no tenderness.       Thoracic paraspinal tenderness  Neurological: She is alert and oriented to person, place, and time. No cranial nerve deficit.  Skin: Skin is warm.    ED Course  Procedures (including critical care time)  Labs Reviewed - No data to display Dg Chest 2 View  09/22/2011  *RADIOLOGY REPORT*  Clinical Data: Productive cough.  CHEST - 2 VIEW  Comparison: 09/04/2011  Findings: Two views of the chest demonstrate clear lungs. Heart and mediastinum are within normal limits.  The trachea is midline. Bony structures are  intact.  Clips in the right upper quadrant of the abdomen.  IMPRESSION: No acute chest findings.  Original Report Authenticated By: Richarda Overlie, M.D.     1. Bronchitis       MDM  Cough, congestion, subjective fever, posttussive emesis.  No respiratory distress, vitals stable.  Lungs clear. PERC negative.  Will treat for bronchitis with albuterol and zithromax.   Date: 09/22/2011  Rate: 94  Rhythm: normal sinus rhythm  QRS Axis: normal  Intervals: normal  ST/T Wave abnormalities: normal  Conduction Disutrbances:none  Narrative Interpretation:   Old EKG Reviewed: unchanged          Glynn Octave, MD 09/22/11 1252

## 2011-09-22 NOTE — ED Notes (Signed)
Productive cough x 4 days with yellow sputum. Coughs so hard at times that it makes her vomit. Worse with laying down. Chest and back are sore from coughing. States she has a bad taste in her mouth when she coughs. Denies fever. No hx of asthma or bronchitis.

## 2011-12-28 ENCOUNTER — Other Ambulatory Visit: Payer: Self-pay

## 2011-12-28 ENCOUNTER — Emergency Department (HOSPITAL_BASED_OUTPATIENT_CLINIC_OR_DEPARTMENT_OTHER)
Admission: EM | Admit: 2011-12-28 | Discharge: 2011-12-28 | Disposition: A | Discharge status: Home / Self Care | Payer: Self-pay | Attending: Emergency Medicine | Admitting: Emergency Medicine

## 2011-12-28 ENCOUNTER — Emergency Department (INDEPENDENT_AMBULATORY_CARE_PROVIDER_SITE_OTHER): Payer: Self-pay

## 2011-12-28 ENCOUNTER — Encounter (HOSPITAL_BASED_OUTPATIENT_CLINIC_OR_DEPARTMENT_OTHER): Payer: Self-pay | Admitting: *Deleted

## 2011-12-28 DIAGNOSIS — R079 Chest pain, unspecified: Secondary | ICD-10-CM

## 2011-12-28 DIAGNOSIS — R059 Cough, unspecified: Secondary | ICD-10-CM | POA: Insufficient documentation

## 2011-12-28 DIAGNOSIS — J45909 Unspecified asthma, uncomplicated: Secondary | ICD-10-CM

## 2011-12-28 DIAGNOSIS — R05 Cough: Secondary | ICD-10-CM

## 2011-12-28 DIAGNOSIS — J45901 Unspecified asthma with (acute) exacerbation: Secondary | ICD-10-CM | POA: Insufficient documentation

## 2011-12-28 DIAGNOSIS — R0602 Shortness of breath: Secondary | ICD-10-CM

## 2011-12-28 DIAGNOSIS — I10 Essential (primary) hypertension: Secondary | ICD-10-CM | POA: Insufficient documentation

## 2011-12-28 MED ORDER — PREDNISONE 50 MG PO TABS
60.0000 mg | ORAL_TABLET | Freq: Once | ORAL | Status: AC
  Administered 2011-12-28: 60 mg via ORAL
  Filled 2011-12-28: qty 1

## 2011-12-28 MED ORDER — ALBUTEROL SULFATE HFA 108 (90 BASE) MCG/ACT IN AERS
2.0000 | INHALATION_SPRAY | RESPIRATORY_TRACT | Status: DC | PRN
  Administered 2011-12-28: 2 via RESPIRATORY_TRACT
  Filled 2011-12-28: qty 6.7

## 2011-12-28 MED ORDER — PREDNISONE 10 MG PO TABS: 20.0000 mg | ORAL_TABLET | Freq: Every day | ORAL | Status: DC

## 2011-12-28 NOTE — ED Provider Notes (Addendum)
History     CSN: [id number]  Arrival date & time 12/28/11  5366   First MD Initiated Contact with Patient 12/28/11 2044      Chief Complaint  Patient presents with  . Cough    (Consider location/radiation/quality/duration/timing/severity/associated sxs/prior treatment) HPI Patient with cough for 6 days.  Symptoms began with sinusitis.  No fever, then in chest.  Tight across chest with coughing of clear sputum.  Patient with albuterol for similar episode earlier this year.  Patient with some dyspnea today.    Past Medical History  Diagnosis Date  . Hypertension   . Migraine     Past Surgical History  Procedure Date  . Cholecystectomy   . Abdominal hysterectomy   . Appendectomy     Family History  Problem Relation Age of Onset  . Hypertension Mother   . Diabetes Father   . Hypertension Father   . Heart attack Neg Hx     History  Substance Use Topics  . Smoking status: Never Smoker   . Smokeless tobacco: Not on file  . Alcohol Use: No    OB History    Grav Para Term Preterm Abortions TAB SAB Ect Mult Living                  Review of Systems  All other systems reviewed and are negative.    Allergies  Review of patient's allergies indicates no known allergies.  Home Medications   Current Outpatient Rx  Name Route Sig Dispense Refill  . ALBUTEROL SULFATE HFA 108 (90 BASE) MCG/ACT IN AERS Inhalation Inhale 2 puffs into the lungs every 4 (four) hours as needed for wheezing. 1 Inhaler 0  . B COMPLEX PO TABS Oral Take 1 tablet by mouth daily.    . ENALAPRIL MALEATE 5 MG PO TABS Oral Take 5 mg by mouth daily.    . ADULT MULTIVITAMIN W/MINERALS CH Oral Take 1 tablet by mouth daily.    . TOPIRAMATE 100 MG PO TABS Oral Take 100 mg by mouth 2 (two) times daily.      BP 156/82  Pulse 94  Temp(Src) 98.2 F (36.8 C) (Oral)  SpO2 99%  Physical Exam  Vitals reviewed. Constitutional: She is oriented to person, place, and time. She appears well-developed and  well-nourished.  HENT:  Head: Normocephalic and atraumatic.  Right Ear: External ear normal.  Left Ear: External ear normal.  Mouth/Throat: Oropharynx is clear and moist.  Eyes: Conjunctivae and EOM are normal. Pupils are equal, round, and reactive to light.  Neck: Normal range of motion. Neck supple.  Cardiovascular: Normal rate, regular rhythm and normal heart sounds.   Pulmonary/Chest: Effort normal. She has wheezes.  Abdominal: Soft. Bowel sounds are normal.  Musculoskeletal: Normal range of motion.  Neurological: She is alert and oriented to person, place, and time.  Skin: Skin is warm and dry.  Psychiatric: She has a normal mood and affect.    ED Course  Procedures (including critical care time)  Labs Reviewed - No data to display Dg Chest 2 View  12/28/2011  *RADIOLOGY REPORT*  Clinical Data: Chest pain.  Shortness of breath.  CHEST - 2 VIEW  Comparison: Two-view chest 09/22/2011.  Findings: The heart size is normal.  The lungs are clear.  The visualized soft tissues and bony thorax are unremarkable.  IMPRESSION: Negative chest.  Original Report Authenticated By: Jamesetta Orleans. MATTERN, M.D.     No diagnosis found.  Date: 12/28/2011  Rate: 90  Rhythm:  normal sinus rhythm  QRS Axis: normal  Intervals: normal  ST/T Wave abnormalities: normal  Conduction Disutrbances:none  Narrative Interpretation:   Old EKG Reviewed: none available     MDM          Hilario Quarry, MD 12/28/11 2100  Hilario Quarry, MD 12/28/11 2108

## 2011-12-28 NOTE — Discharge Instructions (Signed)

## 2011-12-28 NOTE — ED Notes (Signed)
Patient states she has been coughing for 2 weeks. Now having "tightness" in her chest and "trouble breathing".

## 2011-12-28 NOTE — ED Notes (Signed)
Pt has been sick with some nasal drainage last week and reports having a nonproductive cough. Pt is not in any respiratory distress.

## 2012-08-16 ENCOUNTER — Emergency Department (HOSPITAL_BASED_OUTPATIENT_CLINIC_OR_DEPARTMENT_OTHER): Payer: Self-pay

## 2012-08-16 ENCOUNTER — Emergency Department (HOSPITAL_BASED_OUTPATIENT_CLINIC_OR_DEPARTMENT_OTHER)
Admission: EM | Admit: 2012-08-16 | Discharge: 2012-08-16 | Disposition: A | Discharge status: Home / Self Care | Payer: Self-pay | Attending: Emergency Medicine | Admitting: Emergency Medicine

## 2012-08-16 ENCOUNTER — Encounter (HOSPITAL_BASED_OUTPATIENT_CLINIC_OR_DEPARTMENT_OTHER): Payer: Self-pay | Admitting: *Deleted

## 2012-08-16 DIAGNOSIS — R5381 Other malaise: Secondary | ICD-10-CM | POA: Insufficient documentation

## 2012-08-16 DIAGNOSIS — R0602 Shortness of breath: Secondary | ICD-10-CM | POA: Insufficient documentation

## 2012-08-16 DIAGNOSIS — R61 Generalized hyperhidrosis: Secondary | ICD-10-CM | POA: Insufficient documentation

## 2012-08-16 DIAGNOSIS — Z79899 Other long term (current) drug therapy: Secondary | ICD-10-CM | POA: Insufficient documentation

## 2012-08-16 DIAGNOSIS — I10 Essential (primary) hypertension: Secondary | ICD-10-CM | POA: Insufficient documentation

## 2012-08-16 DIAGNOSIS — R42 Dizziness and giddiness: Secondary | ICD-10-CM | POA: Insufficient documentation

## 2012-08-16 DIAGNOSIS — R110 Nausea: Secondary | ICD-10-CM | POA: Insufficient documentation

## 2012-08-16 DIAGNOSIS — R079 Chest pain, unspecified: Secondary | ICD-10-CM | POA: Insufficient documentation

## 2012-08-16 LAB — BASIC METABOLIC PANEL
Chloride: 98 mEq/L (ref 96–112)
Creatinine, Ser: 0.7 mg/dL (ref 0.50–1.10)
GFR calc Af Amer: >90 mL/min (ref >90)
Potassium: 3.8 mEq/L (ref 3.5–5.1)
Sodium: 137 mEq/L (ref 135–145)

## 2012-08-16 LAB — URINALYSIS, ROUTINE W REFLEX MICROSCOPIC
Bilirubin Urine: NEGATIVE
Nitrite: NEGATIVE
Specific Gravity, Urine: 1.016 (ref 1.005–1.030)
Urobilinogen, UA: 1 mg/dL (ref 0.0–1.0)

## 2012-08-16 LAB — CBC
Platelets: 272 10*3/uL (ref 150–400)
RDW: 13.9 % (ref 11.5–15.5)
WBC: 6.2 10*3/uL (ref 4.0–10.5)

## 2012-08-16 LAB — TROPONIN I: Troponin I: <0.3 ng/mL (ref <0.30)

## 2012-08-16 LAB — D-DIMER, QUANTITATIVE: D-Dimer, Quant: 0.32 ug/mL-FEU (ref 0.00–0.48)

## 2012-08-16 MED ORDER — GI COCKTAIL ~~LOC~~
30.0000 mL | Freq: Once | ORAL | Status: AC
  Administered 2012-08-16: 30 mL via ORAL
  Filled 2012-08-16: qty 30

## 2012-08-16 MED ORDER — ACETAMINOPHEN-CODEINE #3 300-30 MG PO TABS: 1.0000 | ORAL_TABLET | Freq: Four times a day (QID) | ORAL | Status: DC | PRN

## 2012-08-16 MED ORDER — PANTOPRAZOLE SODIUM 20 MG PO TBEC: 20.0000 mg | DELAYED_RELEASE_TABLET | Freq: Every day | ORAL | Status: DC

## 2012-08-16 MED ORDER — ONDANSETRON HCL 4 MG/2ML IJ SOLN
4.0000 mg | Freq: Once | INTRAMUSCULAR | Status: AC
  Administered 2012-08-16: 4 mg via INTRAVENOUS
  Filled 2012-08-16: qty 2

## 2012-08-16 MED ORDER — MORPHINE SULFATE 4 MG/ML IJ SOLN
4.0000 mg | Freq: Once | INTRAMUSCULAR | Status: AC
  Administered 2012-08-16: 4 mg via INTRAVENOUS
  Filled 2012-08-16: qty 1

## 2012-08-16 NOTE — ED Notes (Signed)
2-3 days of intermittent chest pain across chest also having some shortness of breath

## 2012-08-16 NOTE — ED Provider Notes (Signed)
Medical screening examination/treatment/procedure(s) were conducted as a shared visit with non-physician practitioner(s) and myself.  I personally evaluated the patient during the encounter  Pt low risk for ACS.  No pain when she exerts herself.  Symptoms have been ongoing for days and she has negative cardiac markers and nl EKG. Low risk for PE with negative d dimer.  Will try antacids.  Follow up with PCP   Rate: 94  Rhythm: normal sinus rhythm  QRS Axis: normal  Intervals: normal  ST/T Wave abnormalities: normal  Conduction Disutrbances:none  Narrative Interpretation: nl  Old EKG Reviewed: no change  Celene Kras, MD 08/16/12 1454

## 2012-08-16 NOTE — ED Provider Notes (Signed)
History     CSN: [id number]  Arrival date & time 08/16/12  1223   First MD Initiated Contact with Patient 08/16/12 1233      Chief Complaint  Patient presents with  . Chest Pain  . Shortness of Breath    (Consider location/radiation/quality/duration/timing/severity/associated sxs/prior treatment) HPI Comments: 46 y/o female with HTN presents to ED with gradual onset chest pain 3 days ago worsening today at 12:00 pm while sitting at her desk at work. She cannot remember what she was doing at onset of chest pain, but it has been constant over the past few days. Today she describes her pain as if "someone is sitting on her chest", non-radiating, rated 9/10. She has not tried any alleviating factors. Never had chest pain like this before. Admits to associated nausea, diaphoresis, and sob. Shortness of breath is worse when laying down. States she feels lightheaded, dizzy, weak, and fatigued. Denies rashes, fever, chills, visual disturbance, confusion. Denies family hx of heart disease before age 40. She is a non smoker. Admits to having acid reflux but states this is nothing like her heartburn.  Patient is a 46 y.o. female presenting with chest pain and shortness of breath. The history is provided by the patient.  Chest Pain Primary symptoms include fatigue, shortness of breath, nausea and dizziness. Pertinent negatives for primary symptoms include no cough.  Dizziness also occurs with nausea, weakness and diaphoresis.   Associated symptoms include diaphoresis and weakness.    Shortness of Breath  Associated symptoms include chest pain and shortness of breath. Pertinent negatives include no cough.    Past Medical History  Diagnosis Date  . Hypertension   . Migraine     Past Surgical History  Procedure Date  . Cholecystectomy   . Abdominal hysterectomy   . Appendectomy     Family History  Problem Relation Age of Onset  . Hypertension Mother   . Diabetes Father   .  Hypertension Father   . Heart attack Neg Hx     History  Substance Use Topics  . Smoking status: Never Smoker   . Smokeless tobacco: Not on file  . Alcohol Use: No    OB History    Grav Para Term Preterm Abortions TAB SAB Ect Mult Living                  Review of Systems  Constitutional: Positive for diaphoresis and fatigue.  HENT: Negative for neck pain and neck stiffness.   Eyes: Negative for visual disturbance.  Respiratory: Positive for shortness of breath. Negative for cough.   Cardiovascular: Positive for chest pain. Negative for leg swelling.  Gastrointestinal: Positive for nausea.  Genitourinary: Negative for difficulty urinating.  Musculoskeletal: Negative for back pain.  Skin: Negative for color change and rash.  Neurological: Positive for dizziness, weakness and light-headedness. Negative for syncope.  Psychiatric/Behavioral: Negative for confusion.    Allergies  Review of patient's allergies indicates no known allergies.  Home Medications   Current Outpatient Rx  Name Route Sig Dispense Refill  . ALBUTEROL SULFATE HFA 108 (90 BASE) MCG/ACT IN AERS Inhalation Inhale 2 puffs into the lungs every 4 (four) hours as needed for wheezing. 1 Inhaler 0  . B COMPLEX PO TABS Oral Take 1 tablet by mouth daily.    . ENALAPRIL MALEATE 5 MG PO TABS Oral Take 5 mg by mouth daily.    . ADULT MULTIVITAMIN W/MINERALS CH Oral Take 1 tablet by mouth daily.    Marland Kitchen  PREDNISONE 10 MG PO TABS Oral Take 2 tablets (20 mg total) by mouth daily. 15 tablet 0  . TOPIRAMATE 100 MG PO TABS Oral Take 100 mg by mouth 2 (two) times daily.      BP 140/77  Pulse 99  Temp 97.6 F (36.4 C) (Oral)  Resp 20  Ht 5\' 2"  (1.575 m)  Wt 180 lb (81.647 kg)  BMI 32.92 kg/m2  SpO2 100%  Physical Exam  Nursing note and vitals reviewed. Constitutional: She is oriented to person, place, and time. She appears well-developed and well-nourished. No distress. Nasal cannula in place.  HENT:  Head:  Normocephalic and atraumatic.  Mouth/Throat: Oropharynx is clear and moist and mucous membranes are normal.  Eyes: Conjunctivae normal and EOM are normal. Pupils are equal, round, and reactive to light.  Neck: Normal range of motion. Neck supple. No JVD present.  Cardiovascular: Normal rate, regular rhythm, normal heart sounds and intact distal pulses.   Pulmonary/Chest: Breath sounds normal. No accessory muscle usage. No respiratory distress. She has no decreased breath sounds. She has no wheezes. She has no rhonchi. She has no rales.  Abdominal: Soft. Normal appearance and bowel sounds are normal.  Musculoskeletal: Normal range of motion.  Neurological: She is alert and oriented to person, place, and time.  Skin: Skin is warm and dry.  Psychiatric: She has a normal mood and affect. Her behavior is normal.    ED Course  Procedures (including critical care time)  Labs Reviewed - No data to display Results for orders placed during the hospital encounter of 08/16/12  CBC      Component Value Range   WBC 6.2  4.0 - 10.5 K/uL   RBC 4.71  3.87 - 5.11 MIL/uL   Hemoglobin 14.0  12.0 - 15.0 g/dL   HCT 16.1  09.6 - 04.5 %   MCV 87.0  78.0 - 100.0 fL   MCH 29.7  26.0 - 34.0 pg   MCHC 34.1  30.0 - 36.0 g/dL   RDW 40.9  81.1 - 91.4 %   Platelets 272  150 - 400 K/uL  BASIC METABOLIC PANEL      Component Value Range   Sodium 137  135 - 145 mEq/L   Potassium 3.8  3.5 - 5.1 mEq/L   Chloride 98  96 - 112 mEq/L   CO2 25  19 - 32 mEq/L   Glucose, Bld 84  70 - 99 mg/dL   BUN 11  6 - 23 mg/dL   Creatinine, Ser 7.82  0.50 - 1.10 mg/dL   Calcium 9.9  8.4 - 95.6 mg/dL   GFR calc non Af Amer >90  >90 mL/min   GFR calc Af Amer >90  >90 mL/min  TROPONIN I      Component Value Range   Troponin I <0.30  <0.30 ng/mL  URINALYSIS, ROUTINE W REFLEX MICROSCOPIC      Component Value Range   Color, Urine YELLOW  YELLOW   APPearance CLEAR  CLEAR   Specific Gravity, Urine 1.016  1.005 - 1.030   pH 6.5   5.0 - 8.0   Glucose, UA NEGATIVE  NEGATIVE mg/dL   Hgb urine dipstick NEGATIVE  NEGATIVE   Bilirubin Urine NEGATIVE  NEGATIVE   Ketones, ur NEGATIVE  NEGATIVE mg/dL   Protein, ur NEGATIVE  NEGATIVE mg/dL   Urobilinogen, UA 1.0  0.0 - 1.0 mg/dL   Nitrite NEGATIVE  NEGATIVE   Leukocytes, UA NEGATIVE  NEGATIVE  D-DIMER, QUANTITATIVE  Component Value Range   D-Dimer, Quant 0.32  0.00 - 0.48 ug/mL-FEU  TROPONIN I      Component Value Range   Troponin I <0.30  <0.30 ng/mL    Dg Chest 2 View  08/16/2012  *RADIOLOGY REPORT*  Clinical Data: Chest pain and shortness of breath for 3 days.  CHEST - 2 VIEW  Comparison: PA and lateral chest 12/28/2011.  Findings: Lungs are clear.  Heart size is normal.  No pneumothorax or pleural fluid.  IMPRESSION: Negative chest.   Original Report Authenticated By: Bernadene Bell. Maricela Curet, M.D.     Date: 08/16/2012  Rate: 94  Rhythm: normal sinus rhythm  QRS Axis: normal  Intervals: normal  ST/T Wave abnormalities: normal  Conduction Disutrbances:none  Narrative Interpretation: no stemi, normal EKG  Old EKG Reviewed: unchanged    1. Chest pain   2. Shortness of breath       MDM  46 y/o female with worsening chest pain around 12:00 today while sitting at her work desk. Patient asked to speak with me and asked if her pain could be due to stress. States she has been under increased stress over the past month, but nothing in specific has happened today or over the past few days.  3:59 PM No acute findings seen on labs, ekg, or xray. Her pain has decreased since receiving GI cocktail. Discharged with protonix and resource list for f/u with a PCP. Advised her to discuss anxiety with a PCP. Close return precautions discussed. No significant cardiac risk factors besides HTN. Case discussed with Dr. Lynelle Doctor who also evaluated patient and agrees with plan of care.       Trevor Mace, PA-C 08/16/12 [phone]

## 2012-10-08 ENCOUNTER — Encounter (HOSPITAL_BASED_OUTPATIENT_CLINIC_OR_DEPARTMENT_OTHER): Payer: Self-pay | Admitting: *Deleted

## 2012-10-08 ENCOUNTER — Emergency Department (HOSPITAL_BASED_OUTPATIENT_CLINIC_OR_DEPARTMENT_OTHER)
Admission: EM | Admit: 2012-10-08 | Discharge: 2012-10-08 | Disposition: A | Discharge status: Home / Self Care | Payer: Self-pay | Attending: Emergency Medicine | Admitting: Emergency Medicine

## 2012-10-08 DIAGNOSIS — Z79899 Other long term (current) drug therapy: Secondary | ICD-10-CM | POA: Insufficient documentation

## 2012-10-08 DIAGNOSIS — I10 Essential (primary) hypertension: Secondary | ICD-10-CM | POA: Insufficient documentation

## 2012-10-08 DIAGNOSIS — G43909 Migraine, unspecified, not intractable, without status migrainosus: Secondary | ICD-10-CM | POA: Insufficient documentation

## 2012-10-08 DIAGNOSIS — L089 Local infection of the skin and subcutaneous tissue, unspecified: Secondary | ICD-10-CM | POA: Insufficient documentation

## 2012-10-08 MED ORDER — HYDROCODONE-ACETAMINOPHEN 5-325 MG PO TABS: 2.0000 | ORAL_TABLET | ORAL | Status: DC | PRN

## 2012-10-08 MED ORDER — SULFAMETHOXAZOLE-TRIMETHOPRIM 800-160 MG PO TABS: 1.0000 | ORAL_TABLET | Freq: Two times a day (BID) | ORAL | Status: DC

## 2012-10-08 NOTE — ED Provider Notes (Signed)
History     CSN: [id number]  Arrival date & time 10/08/12  1818   First MD Initiated Contact with Patient 10/08/12 1841      Chief Complaint  Patient presents with  . Abscess    (Consider location/radiation/quality/duration/timing/severity/associated sxs/prior treatment) Patient is a 46 y.o. female presenting with abscess. The history is provided by the patient. No language interpreter was used.  Abscess  This is a new problem. Episode onset: 2 days ago. The onset was gradual. The problem occurs continuously. The problem has been gradually worsening. The abscess is present on the abdomen. The problem is moderate. The abscess is characterized by painfulness and redness. There were no sick contacts. She has received no recent medical care.  Pt complains of a painful swollen area to mid pubic area  Past Medical History  Diagnosis Date  . Hypertension   . Migraine     Past Surgical History  Procedure Date  . Cholecystectomy   . Abdominal hysterectomy   . Appendectomy     Family History  Problem Relation Age of Onset  . Hypertension Mother   . Diabetes Father   . Hypertension Father   . Heart attack Neg Hx     History  Substance Use Topics  . Smoking status: Never Smoker   . Smokeless tobacco: Not on file  . Alcohol Use: No    OB History    Grav Para Term Preterm Abortions TAB SAB Ect Mult Living                  Review of Systems  Skin: Positive for color change.  All other systems reviewed and are negative.    Allergies  Review of patient's allergies indicates no known allergies.  Home Medications   Current Outpatient Rx  Name  Route  Sig  Dispense  Refill  . ACETAMINOPHEN-CODEINE #3 300-30 MG PO TABS   Oral   Take 1-2 tablets by mouth every 6 (six) hours as needed for pain.   15 tablet   0   . ALBUTEROL SULFATE HFA 108 (90 BASE) MCG/ACT IN AERS   Inhalation   Inhale 2 puffs into the lungs every 4 (four) hours as needed for wheezing.   1  Inhaler   0   . B COMPLEX PO TABS   Oral   Take 1 tablet by mouth daily.         . ENALAPRIL MALEATE 5 MG PO TABS   Oral   Take 5 mg by mouth daily.         . ADULT MULTIVITAMIN W/MINERALS CH   Oral   Take 1 tablet by mouth daily.         Marland Kitchen PANTOPRAZOLE SODIUM 20 MG PO TBEC   Oral   Take 1 tablet (20 mg total) by mouth daily.   30 tablet   0   . PREDNISONE 10 MG PO TABS   Oral   Take 2 tablets (20 mg total) by mouth daily.   15 tablet   0   . TOPIRAMATE 100 MG PO TABS   Oral   Take 100 mg by mouth 2 (two) times daily.           BP 139/97  Pulse 81  Temp 98.3 F (36.8 C) (Oral)  Resp 20  Wt 183 lb 3 oz (83.093 kg)  SpO2 98%  Physical Exam  Nursing note and vitals reviewed. Constitutional: She is oriented to person, place, and time. She appears  well-developed and well-nourished.  HENT:  Head: Normocephalic.  Pulmonary/Chest: Effort normal.  Abdominal: Soft.  Musculoskeletal: She exhibits tenderness.  Neurological: She is alert and oriented to person, place, and time.  Skin: There is erythema.  Psychiatric: She has a normal mood and affect.  swollen area pubic area, no fluctuance  ED Course  Procedures (including critical care time)  Labs Reviewed - No data to display No results found.   No diagnosis found.    MDM  Pt advised continue soaking area,   Pt given rx for septra and hydrocodone for pain        Lonia Skinner [place], [place] 10/08/12 1908

## 2012-10-08 NOTE — ED Notes (Signed)
Chart reviewed.

## 2012-10-08 NOTE — ED Notes (Signed)
Abscess to her pubic region x 2 days.

## 2012-10-09 NOTE — ED Provider Notes (Signed)
Medical screening examination/treatment/procedure(s) were performed by non-physician practitioner and as supervising physician I was immediately available for consultation/collaboration.   Yaacov Koziol, MD 10/09/12 0011 

## 2012-10-13 ENCOUNTER — Emergency Department (HOSPITAL_BASED_OUTPATIENT_CLINIC_OR_DEPARTMENT_OTHER)
Admission: EM | Admit: 2012-10-13 | Discharge: 2012-10-13 | Disposition: A | Discharge status: Home / Self Care | Payer: Self-pay | Attending: Emergency Medicine | Admitting: Emergency Medicine

## 2012-10-13 ENCOUNTER — Encounter (HOSPITAL_BASED_OUTPATIENT_CLINIC_OR_DEPARTMENT_OTHER): Payer: Self-pay | Admitting: *Deleted

## 2012-10-13 DIAGNOSIS — IMO0002 Reserved for concepts with insufficient information to code with codable children: Secondary | ICD-10-CM | POA: Insufficient documentation

## 2012-10-13 DIAGNOSIS — L03319 Cellulitis of trunk, unspecified: Secondary | ICD-10-CM | POA: Insufficient documentation

## 2012-10-13 DIAGNOSIS — L02214 Cutaneous abscess of groin: Secondary | ICD-10-CM

## 2012-10-13 DIAGNOSIS — L02412 Cutaneous abscess of left axilla: Secondary | ICD-10-CM

## 2012-10-13 DIAGNOSIS — Z79899 Other long term (current) drug therapy: Secondary | ICD-10-CM | POA: Insufficient documentation

## 2012-10-13 DIAGNOSIS — I10 Essential (primary) hypertension: Secondary | ICD-10-CM | POA: Insufficient documentation

## 2012-10-13 DIAGNOSIS — L02219 Cutaneous abscess of trunk, unspecified: Secondary | ICD-10-CM | POA: Insufficient documentation

## 2012-10-13 DIAGNOSIS — Z8669 Personal history of other diseases of the nervous system and sense organs: Secondary | ICD-10-CM | POA: Insufficient documentation

## 2012-10-13 MED ORDER — HYDROCODONE-ACETAMINOPHEN 5-325 MG PO TABS
ORAL_TABLET | ORAL | Status: AC
  Filled 2012-10-13: qty 1

## 2012-10-13 MED ORDER — HYDROCODONE-ACETAMINOPHEN 5-325 MG PO TABS
1.0000 | ORAL_TABLET | Freq: Once | ORAL | Status: AC
  Administered 2012-10-13: 1 via ORAL

## 2012-10-13 MED ORDER — HYDROCODONE-ACETAMINOPHEN 5-325 MG PO TABS: 1.0000 | ORAL_TABLET | Freq: Four times a day (QID) | ORAL | Status: DC | PRN

## 2012-10-13 NOTE — ED Provider Notes (Signed)
History     CSN: [id number]  Arrival date & time 10/13/12  0447   First MD Initiated Contact with Patient 10/13/12 0500      Chief Complaint  Patient presents with  . Abscess    (Consider location/radiation/quality/duration/timing/severity/associated sxs/prior treatment) HPI This is a 46 year old female with a one-week history of an abscess on her mons pubis. She was seen here 5 days ago and treated with trimethoprim sulfamethoxazole. No I&D was done at that time. Since then the abscess is worsened and she no longer tolerate the pain. The pain is moderate to severe and worse with palpation or movement. She also has a smaller abscess that his arrhythmias and in her left axilla. There's been some purulent drainage from the abscess. She denies fever or chills. She has had some transient nausea but no vomiting.  Past Medical History  Diagnosis Date  . Hypertension   . Migraine     Past Surgical History  Procedure Date  . Cholecystectomy   . Abdominal hysterectomy   . Appendectomy     Family History  Problem Relation Age of Onset  . Hypertension Mother   . Diabetes Father   . Hypertension Father   . Heart attack Neg Hx     History  Substance Use Topics  . Smoking status: Never Smoker   . Smokeless tobacco: Not on file  . Alcohol Use: No    OB History    Grav Para Term Preterm Abortions TAB SAB Ect Mult Living                  Review of Systems  All other systems reviewed and are negative.    Allergies  Review of patient's allergies indicates no known allergies.  Home Medications   Current Outpatient Rx  Name  Route  Sig  Dispense  Refill  . ACETAMINOPHEN-CODEINE #3 300-30 MG PO TABS   Oral   Take 1-2 tablets by mouth every 6 (six) hours as needed for pain.   15 tablet   0   . ALBUTEROL SULFATE HFA 108 (90 BASE) MCG/ACT IN AERS   Inhalation   Inhale 2 puffs into the lungs every 4 (four) hours as needed for wheezing.   1 Inhaler   0   . B COMPLEX  PO TABS   Oral   Take 1 tablet by mouth daily.         . ENALAPRIL MALEATE 5 MG PO TABS   Oral   Take 5 mg by mouth daily.         Marland Kitchen HYDROCODONE-ACETAMINOPHEN 5-325 MG PO TABS   Oral   Take 2 tablets by mouth every 4 (four) hours as needed for pain.   10 tablet   0   . ADULT MULTIVITAMIN W/MINERALS CH   Oral   Take 1 tablet by mouth daily.         Marland Kitchen PANTOPRAZOLE SODIUM 20 MG PO TBEC   Oral   Take 1 tablet (20 mg total) by mouth daily.   30 tablet   0   . PREDNISONE 10 MG PO TABS   Oral   Take 2 tablets (20 mg total) by mouth daily.   15 tablet   0   . SULFAMETHOXAZOLE-TRIMETHOPRIM 800-160 MG PO TABS   Oral   Take 1 tablet by mouth 2 (two) times daily.   20 tablet   0   . TOPIRAMATE 100 MG PO TABS   Oral   Take 100 mg  by mouth 2 (two) times daily.           BP 138/80  Pulse 79  Temp 97.4 F (36.3 C) (Oral)  Resp 16  SpO2 98%  Physical Exam General: Well-developed, well-nourished female in no acute distress; appearance consistent with age of record HENT: normocephalic, atraumatic Eyes: Normal appearance Neck: supple Heart: regular rate and rhythm Lungs: Normal respiratory effort and excursion Abdomen: soft; nondistended Extremities: No deformity; full range of motion Neurologic: Awake, alert and oriented; motor function intact in all extremities and symmetric; no facial droop Skin: Warm and dry; abscess of mons pubis; pointing abscess of left axilla Psychiatric: Anxious    ED Course  Procedures (including critical care time)  INCISION AND DRAINAGE Performed by: Paula Libra L Consent: Verbal consent obtained. Risks and benefits: risks, benefits and alternatives were discussed Type: abscess  Body area: Right mons pubis  Anesthesia: local infiltration  Incision was made with a scalpel.  Local anesthetic: lidocaine 2 % with epinephrine  Anesthetic total: 2 ml  Complexity: complex Blunt dissection to break up  loculations  Drainage: purulent  Drainage amount: Small   Packing material: 1/4 in iodoform gauze  Patient tolerance: Patient tolerated the procedure well with no immediate complications.    INCISION AND DRAINAGE Performed by: Paula Libra L Consent: Verbal consent obtained. Risks and benefits: risks, benefits and alternatives were discussed Type: abscess  Body area: Left axilla  Anesthesia: local infiltration  Incision was made with a scalpel.  Local anesthetic: lidocaine 2 % with epinephrine  Anesthetic total: 1 ml  Complexity: Simple Blunt dissection to break up loculations  Drainage: purulent  Drainage amount: Small   Packing material: 1/4 in iodoform gauze  Patient tolerance: Patient tolerated the procedure well with no immediate complications.       MDM  Patient advised to remove packing in 48 hours. She was advised to return here should symptoms worsen instead of improve.        Hanley Seamen, MD 10/13/12 [phone]

## 2012-10-13 NOTE — ED Notes (Signed)
Pt was seen here on Monday and was told to return to ED in a few days pt was put on abx tx with no relief

## 2012-10-19 ENCOUNTER — Emergency Department (HOSPITAL_BASED_OUTPATIENT_CLINIC_OR_DEPARTMENT_OTHER)
Admission: EM | Admit: 2012-10-19 | Discharge: 2012-10-19 | Disposition: A | Discharge status: Home / Self Care | Payer: Self-pay | Attending: Emergency Medicine | Admitting: Emergency Medicine

## 2012-10-19 ENCOUNTER — Encounter (HOSPITAL_BASED_OUTPATIENT_CLINIC_OR_DEPARTMENT_OTHER): Payer: Self-pay | Admitting: *Deleted

## 2012-10-19 DIAGNOSIS — I10 Essential (primary) hypertension: Secondary | ICD-10-CM | POA: Insufficient documentation

## 2012-10-19 DIAGNOSIS — G43909 Migraine, unspecified, not intractable, without status migrainosus: Secondary | ICD-10-CM | POA: Insufficient documentation

## 2012-10-19 DIAGNOSIS — Z8679 Personal history of other diseases of the circulatory system: Secondary | ICD-10-CM | POA: Insufficient documentation

## 2012-10-19 DIAGNOSIS — Z4801 Encounter for change or removal of surgical wound dressing: Secondary | ICD-10-CM | POA: Insufficient documentation

## 2012-10-19 DIAGNOSIS — Z9889 Other specified postprocedural states: Secondary | ICD-10-CM | POA: Insufficient documentation

## 2012-10-19 DIAGNOSIS — Z79899 Other long term (current) drug therapy: Secondary | ICD-10-CM | POA: Insufficient documentation

## 2012-10-19 DIAGNOSIS — L0390 Cellulitis, unspecified: Secondary | ICD-10-CM

## 2012-10-19 DIAGNOSIS — Z5189 Encounter for other specified aftercare: Secondary | ICD-10-CM

## 2012-10-19 MED ORDER — CLINDAMYCIN HCL 300 MG PO CAPS: 300.0000 mg | ORAL_CAPSULE | Freq: Four times a day (QID) | ORAL | Status: DC

## 2012-10-19 NOTE — ED Notes (Signed)
Pt had groin abscess opened on Saturday- here for wound check

## 2012-10-19 NOTE — ED Provider Notes (Signed)
History     CSN: [id number]  Arrival date & time 10/19/12  1755   First MD Initiated Contact with Patient 10/19/12 1921      Chief Complaint  Patient presents with  . Wound Check    (Consider location/radiation/quality/duration/timing/severity/associated sxs/prior treatment) The history is provided by the patient.  Veronica Braun is a 46 y.o. female hx of HTN, migraine here with wound check. She had I&D performed a week ago and was told to remove packing 2 days later. She was placed on bactrim for the last week. She said that the wound hasn't been draining. However, she is still having pain around the site and it is still swollen. No fevers. No hx of MRSA.    Past Medical History  Diagnosis Date  . Hypertension   . Migraine     Past Surgical History  Procedure Date  . Cholecystectomy   . Abdominal hysterectomy   . Appendectomy     Family History  Problem Relation Age of Onset  . Hypertension Mother   . Diabetes Father   . Hypertension Father   . Heart attack Neg Hx     History  Substance Use Topics  . Smoking status: Never Smoker   . Smokeless tobacco: Never Used  . Alcohol Use: No    OB History    Grav Para Term Preterm Abortions TAB SAB Ect Mult Living                  Review of Systems  Skin: Positive for wound.  All other systems reviewed and are negative.    Allergies  Review of patient's allergies indicates no known allergies.  Home Medications   Current Outpatient Rx  Name  Route  Sig  Dispense  Refill  . B COMPLEX PO TABS   Oral   Take 1 tablet by mouth daily.         . ENALAPRIL MALEATE 5 MG PO TABS   Oral   Take 5 mg by mouth daily.         . ADULT MULTIVITAMIN W/MINERALS CH   Oral   Take 1 tablet by mouth daily.         Marland Kitchen PANTOPRAZOLE SODIUM 20 MG PO TBEC   Oral   Take 1 tablet (20 mg total) by mouth daily.   30 tablet   0   . SULFAMETHOXAZOLE-TRIMETHOPRIM 800-160 MG PO TABS   Oral   Take 1 tablet by mouth 2 (two)  times daily.   20 tablet   0   . TOPIRAMATE 100 MG PO TABS   Oral   Take 100 mg by mouth 2 (two) times daily.         . ACETAMINOPHEN-CODEINE #3 300-30 MG PO TABS   Oral   Take 1-2 tablets by mouth every 6 (six) hours as needed for pain.   15 tablet   0   . ALBUTEROL SULFATE HFA 108 (90 BASE) MCG/ACT IN AERS   Inhalation   Inhale 2 puffs into the lungs every 4 (four) hours as needed for wheezing.   1 Inhaler   0   . HYDROCODONE-ACETAMINOPHEN 5-325 MG PO TABS   Oral   Take 2 tablets by mouth every 4 (four) hours as needed for pain.   10 tablet   0   . HYDROCODONE-ACETAMINOPHEN 5-325 MG PO TABS   Oral   Take 1-2 tablets by mouth every 6 (six) hours as needed for pain.   10 tablet  0   . PREDNISONE 10 MG PO TABS   Oral   Take 2 tablets (20 mg total) by mouth daily.   15 tablet   0     BP 150/82  Pulse 75  Temp 98.2 F (36.8 C) (Oral)  Resp 18  Ht 5' 2.5" (1.588 m)  Wt 183 lb (83.008 kg)  BMI 32.94 kg/m2  SpO2 100%  Physical Exam  Nursing note and vitals reviewed. Constitutional: She is oriented to person, place, and time. She appears well-developed and well-nourished.  HENT:  Head: Normocephalic.  Mouth/Throat: Oropharynx is clear and moist.  Eyes: Conjunctivae normal are normal. Pupils are equal, round, and reactive to light.  Neck: Normal range of motion. Neck supple.  Cardiovascular: Normal rate.   Pulmonary/Chest: Effort normal.  Abdominal: Soft. Bowel sounds are normal.  Musculoskeletal: Normal range of motion.  Neurological: She is alert and oriented to person, place, and time.  Skin: Skin is warm and dry.       Around R groin area, there is a 1 in x 1 in area of mild cellulitis with firmness around it. No fluctuance. Mildly tender.   Psychiatric: She has a normal mood and affect. Her behavior is normal. Judgment and thought content normal.    ED Course  Procedures (including critical care time)  EMERGENCY DEPARTMENT US SOFT TISSUE  INTERPRETATION "Study: Limited Ultrasound of the noted body part in comments below"  INDICATIONS: Soft tissue infection Multiple views of the body part are obtained with a multi-frequency linear probe  PERFORMED BY:  Myself  IMAGES ARCHIVED?: Yes  SIDE:Right   BODY PART:Other soft tisse (comment in note)  FINDINGS: No abcess noted and Cellulitis present  LIMITATIONS:  Emergent Procedure  INTERPRETATION:  No abcess noted and Cellulitis present  COMMENT:  R groin area + celullitis, there is small collection but no obvious abscess.     Labs Reviewed - No data to display No results found.   No diagnosis found.    MDM  Veronica Braun is a 46 y.o. female here with cellulitis even with abx. She may have a small collection that is too small to drain. Will change to clindamycin and recommend f/u in a week for repeat wound check.          Richardean Canal, MD 10/19/12 [phone]

## 2012-12-06 ENCOUNTER — Emergency Department (HOSPITAL_BASED_OUTPATIENT_CLINIC_OR_DEPARTMENT_OTHER)
Admission: EM | Admit: 2012-12-06 | Discharge: 2012-12-06 | Disposition: A | Discharge status: Home / Self Care | Payer: Self-pay | Attending: Emergency Medicine | Admitting: Emergency Medicine

## 2012-12-06 ENCOUNTER — Encounter (HOSPITAL_BASED_OUTPATIENT_CLINIC_OR_DEPARTMENT_OTHER): Payer: Self-pay | Admitting: *Deleted

## 2012-12-06 DIAGNOSIS — I10 Essential (primary) hypertension: Secondary | ICD-10-CM | POA: Insufficient documentation

## 2012-12-06 DIAGNOSIS — J111 Influenza due to unidentified influenza virus with other respiratory manifestations: Secondary | ICD-10-CM

## 2012-12-06 DIAGNOSIS — J3489 Other specified disorders of nose and nasal sinuses: Secondary | ICD-10-CM | POA: Insufficient documentation

## 2012-12-06 DIAGNOSIS — Z8679 Personal history of other diseases of the circulatory system: Secondary | ICD-10-CM | POA: Insufficient documentation

## 2012-12-06 DIAGNOSIS — R059 Cough, unspecified: Secondary | ICD-10-CM | POA: Insufficient documentation

## 2012-12-06 DIAGNOSIS — Z79899 Other long term (current) drug therapy: Secondary | ICD-10-CM | POA: Insufficient documentation

## 2012-12-06 DIAGNOSIS — R112 Nausea with vomiting, unspecified: Secondary | ICD-10-CM | POA: Insufficient documentation

## 2012-12-06 DIAGNOSIS — R51 Headache: Secondary | ICD-10-CM | POA: Insufficient documentation

## 2012-12-06 DIAGNOSIS — R05 Cough: Secondary | ICD-10-CM | POA: Insufficient documentation

## 2012-12-06 MED ORDER — ONDANSETRON 8 MG PO TBDP
8.0000 mg | ORAL_TABLET | Freq: Once | ORAL | Status: AC
  Administered 2012-12-06: 8 mg via ORAL
  Filled 2012-12-06: qty 1

## 2012-12-06 MED ORDER — GUAIFENESIN-CODEINE 100-10 MG/5ML PO SYRP: 5.0000 mL | ORAL_SOLUTION | Freq: Three times a day (TID) | ORAL | Status: DC | PRN

## 2012-12-06 MED ORDER — PROMETHAZINE HCL 25 MG PO TABS: 25.0000 mg | ORAL_TABLET | Freq: Four times a day (QID) | ORAL | Status: DC | PRN

## 2012-12-06 MED ORDER — KETOROLAC TROMETHAMINE 60 MG/2ML IM SOLN
60.0000 mg | Freq: Once | INTRAMUSCULAR | Status: AC
  Administered 2012-12-06: 60 mg via INTRAMUSCULAR
  Filled 2012-12-06: qty 2

## 2012-12-06 NOTE — ED Notes (Signed)
Pt amb to room 2 with quick steady gait in nad.  Pt reports cough and congestion and nausea x 3 days. md at bedside for exam.

## 2012-12-06 NOTE — ED Notes (Signed)
Pt assisted to call for ride home. Pt requests to stay in room and rest, states her ride should be here within 15 minutes. Pt given warm blanket and call light.

## 2012-12-06 NOTE — ED Provider Notes (Signed)
History     CSN: [id number]  Arrival date & time 12/06/12  0920   First MD Initiated Contact with Patient 12/06/12 0930      Chief Complaint  Patient presents with  . Cough  . Nasal Congestion    (Consider location/radiation/quality/duration/timing/severity/associated sxs/prior treatment) HPI Comments: Patient presents to the ER for evaluation of headache (moderate throbbing), sinus congestion, cough, generalized body aches, malaise with nausea and vomiting. Symptoms began 3 days ago. She has used over-the-counter medication without improvement.  Patient is a 47 y.o. female presenting with cough.  Cough    Past Medical History  Diagnosis Date  . Hypertension   . Migraine     Past Surgical History  Procedure Date  . Cholecystectomy   . Abdominal hysterectomy   . Appendectomy     Family History  Problem Relation Age of Onset  . Hypertension Mother   . Diabetes Father   . Hypertension Father   . Heart attack Neg Hx     History  Substance Use Topics  . Smoking status: Never Smoker   . Smokeless tobacco: Never Used  . Alcohol Use: No    OB History    Grav Para Term Preterm Abortions TAB SAB Ect Mult Living                  Review of Systems  HENT: Positive for congestion.   Respiratory: Positive for cough.   Gastrointestinal: Positive for nausea and vomiting.  All other systems reviewed and are negative.    Allergies  Review of patient's allergies indicates no known allergies.  Home Medications   Current Outpatient Rx  Name  Route  Sig  Dispense  Refill  . ACETAMINOPHEN-CODEINE #3 300-30 MG PO TABS   Oral   Take 1-2 tablets by mouth every 6 (six) hours as needed for pain.   15 tablet   0   . ALBUTEROL SULFATE HFA 108 (90 BASE) MCG/ACT IN AERS   Inhalation   Inhale 2 puffs into the lungs every 4 (four) hours as needed for wheezing.   1 Inhaler   0   . B COMPLEX PO TABS   Oral   Take 1 tablet by mouth daily.         Marland Kitchen CLINDAMYCIN HCL  300 MG PO CAPS   Oral   Take 1 capsule (300 mg total) by mouth 4 (four) times daily. X 7 days   28 capsule   0   . ENALAPRIL MALEATE 5 MG PO TABS   Oral   Take 5 mg by mouth daily.         Marland Kitchen HYDROCODONE-ACETAMINOPHEN 5-325 MG PO TABS   Oral   Take 2 tablets by mouth every 4 (four) hours as needed for pain.   10 tablet   0   . HYDROCODONE-ACETAMINOPHEN 5-325 MG PO TABS   Oral   Take 1-2 tablets by mouth every 6 (six) hours as needed for pain.   10 tablet   0   . ADULT MULTIVITAMIN W/MINERALS CH   Oral   Take 1 tablet by mouth daily.         Marland Kitchen PANTOPRAZOLE SODIUM 20 MG PO TBEC   Oral   Take 1 tablet (20 mg total) by mouth daily.   30 tablet   0   . PREDNISONE 10 MG PO TABS   Oral   Take 2 tablets (20 mg total) by mouth daily.   15 tablet   0   .  SULFAMETHOXAZOLE-TRIMETHOPRIM 800-160 MG PO TABS   Oral   Take 1 tablet by mouth 2 (two) times daily.   20 tablet   0   . TOPIRAMATE 100 MG PO TABS   Oral   Take 100 mg by mouth 2 (two) times daily.           BP 144/88  Pulse 102  Temp 98.3 F (36.8 C) (Oral)  Resp 20  Ht 5\' 2"  (1.575 m)  Wt 180 lb (81.647 kg)  BMI 32.92 kg/m2  SpO2 97%  Physical Exam  Constitutional: She is oriented to person, place, and time. She appears well-developed and well-nourished. No distress.  HENT:  Head: Normocephalic and atraumatic.  Right Ear: Hearing normal.  Nose: Mucosal edema present. Right sinus exhibits no maxillary sinus tenderness and no frontal sinus tenderness. Left sinus exhibits no maxillary sinus tenderness and no frontal sinus tenderness.  Mouth/Throat: Oropharynx is clear and moist and mucous membranes are normal.  Eyes: Conjunctivae normal and EOM are normal. Pupils are equal, round, and reactive to light.  Neck: Normal range of motion. Neck supple.  Cardiovascular: Normal rate, regular rhythm, S1 normal and S2 normal.  Exam reveals no gallop and no friction rub.   No murmur heard. Pulmonary/Chest:  Effort normal and breath sounds normal. No respiratory distress. She exhibits no tenderness.  Abdominal: Soft. Normal appearance and bowel sounds are normal. There is no hepatosplenomegaly. There is no tenderness. There is no rebound, no guarding, no tenderness at McBurney's point and negative Murphy's sign. No hernia.  Musculoskeletal: Normal range of motion.  Neurological: She is alert and oriented to person, place, and time. She has normal strength. No cranial nerve deficit or sensory deficit. Coordination normal. GCS eye subscore is 4. GCS verbal subscore is 5. GCS motor subscore is 6.  Skin: Skin is warm, dry and intact. No rash noted. No cyanosis.  Psychiatric: She has a normal mood and affect. Her speech is normal and behavior is normal. Thought content normal.    ED Course  Procedures (including critical care time)  Labs Reviewed - No data to display No results found.   Diagnosis: Influenza    MDM  Patient presents to ER with complaints of flulike symptoms. She has had a cough but her lungs are clear and her oxygenation is normal. No clinical concern for pneumonia there is no bronchospasm. She has had nausea and vomiting but abdominal exam is benign. Symptoms are consistent with prevalent influenza in the community. Patient will be treated symptomatically.        Gilda Crease, MD 12/06/12 [phone]

## 2013-01-22 ENCOUNTER — Emergency Department (HOSPITAL_BASED_OUTPATIENT_CLINIC_OR_DEPARTMENT_OTHER)
Admission: EM | Admit: 2013-01-22 | Discharge: 2013-01-22 | Disposition: A | Discharge status: Home / Self Care | Payer: Self-pay | Attending: Emergency Medicine | Admitting: Emergency Medicine

## 2013-01-22 ENCOUNTER — Encounter (HOSPITAL_BASED_OUTPATIENT_CLINIC_OR_DEPARTMENT_OTHER): Payer: Self-pay | Admitting: *Deleted

## 2013-01-22 DIAGNOSIS — Z79899 Other long term (current) drug therapy: Secondary | ICD-10-CM | POA: Insufficient documentation

## 2013-01-22 DIAGNOSIS — J45909 Unspecified asthma, uncomplicated: Secondary | ICD-10-CM | POA: Insufficient documentation

## 2013-01-22 DIAGNOSIS — I10 Essential (primary) hypertension: Secondary | ICD-10-CM | POA: Insufficient documentation

## 2013-01-22 DIAGNOSIS — G43909 Migraine, unspecified, not intractable, without status migrainosus: Secondary | ICD-10-CM | POA: Insufficient documentation

## 2013-01-22 DIAGNOSIS — R112 Nausea with vomiting, unspecified: Secondary | ICD-10-CM | POA: Insufficient documentation

## 2013-01-22 HISTORY — DX: Unspecified asthma, uncomplicated: J45.909

## 2013-01-22 MED ORDER — METOCLOPRAMIDE HCL 5 MG/ML IJ SOLN
10.0000 mg | Freq: Once | INTRAMUSCULAR | Status: AC
  Administered 2013-01-22: 10 mg via INTRAVENOUS
  Filled 2013-01-22: qty 2

## 2013-01-22 MED ORDER — KETOROLAC TROMETHAMINE 30 MG/ML IJ SOLN
30.0000 mg | Freq: Once | INTRAMUSCULAR | Status: AC
  Administered 2013-01-22: 30 mg via INTRAVENOUS
  Filled 2013-01-22: qty 1

## 2013-01-22 MED ORDER — SODIUM CHLORIDE 0.9 % IV BOLUS (SEPSIS)
1000.0000 mL | Freq: Once | INTRAVENOUS | Status: AC
  Administered 2013-01-22: 1000 mL via INTRAVENOUS

## 2013-01-22 NOTE — ED Provider Notes (Signed)
History     CSN: [id number]  Arrival date & time 01/22/13  Veronica Braun   First MD Initiated Contact with Patient 01/22/13 1918      Chief Complaint  Patient presents with  . Migraine    (Consider location/radiation/quality/duration/timing/severity/associated sxs/prior treatment) Patient is a 47 y.o. female presenting with migraines.  Migraine   Pt with history of migraine headache reports severe throbbing diffuse headache since yesterday associated with photophobia, nausea and vomiting. Similar to previous, not improved with home Topamax.   Past Medical History  Diagnosis Date  . Hypertension   . Migraine   . Asthma     Past Surgical History  Procedure Laterality Date  . Cholecystectomy    . Abdominal hysterectomy    . Appendectomy      Family History  Problem Relation Age of Onset  . Hypertension Mother   . Diabetes Father   . Hypertension Father   . Heart attack Neg Hx     History  Substance Use Topics  . Smoking status: Never Smoker   . Smokeless tobacco: Never Used  . Alcohol Use: No    OB History   Grav Para Term Preterm Abortions TAB SAB Ect Mult Living                  Review of Systems All other systems reviewed and are negative except as noted in HPI.    Allergies  Review of patient's allergies indicates no known allergies.  Home Medications   Current Outpatient Rx  Name  Route  Sig  Dispense  Refill  . albuterol (PROVENTIL HFA;VENTOLIN HFA) 108 (90 BASE) MCG/ACT inhaler   Inhalation   Inhale 2 puffs into the lungs every 4 (four) hours as needed for wheezing.   1 Inhaler   0   . b complex vitamins tablet   Oral   Take 1 tablet by mouth daily.         . enalapril (VASOTEC) 5 MG tablet   Oral   Take 5 mg by mouth daily.         Marland Kitchen lisinopril-hydrochlorothiazide (PRINZIDE,ZESTORETIC) 10-12.5 MG per tablet   Oral   Take 1 tablet by mouth daily.         . Multiple Vitamin (MULITIVITAMIN WITH MINERALS) TABS   Oral   Take 1 tablet  by mouth daily.         . promethazine (PHENERGAN) 25 MG tablet   Oral   Take 1 tablet (25 mg total) by mouth every 6 (six) hours as needed for nausea.   30 tablet   0   . topiramate (TOPAMAX) 100 MG tablet   Oral   Take 100 mg by mouth 2 (two) times daily.         Marland Kitchen acetaminophen-codeine (TYLENOL #3) 300-30 MG per tablet   Oral   Take 1-2 tablets by mouth every 6 (six) hours as needed for pain.   15 tablet   0   . clindamycin (CLEOCIN) 300 MG capsule   Oral   Take 1 capsule (300 mg total) by mouth 4 (four) times daily. X 7 days   28 capsule   0   . guaiFENesin-codeine (ROBITUSSIN AC) 100-10 MG/5ML syrup   Oral   Take 5 mLs by mouth 3 (three) times daily as needed for cough.   120 mL   0   . HYDROcodone-acetaminophen (NORCO/VICODIN) 5-325 MG per tablet   Oral   Take 2 tablets by mouth every 4 (four)  hours as needed for pain.   10 tablet   0   . HYDROcodone-acetaminophen (NORCO/VICODIN) 5-325 MG per tablet   Oral   Take 1-2 tablets by mouth every 6 (six) hours as needed for pain.   10 tablet   0   . pantoprazole (PROTONIX) 20 MG tablet   Oral   Take 1 tablet (20 mg total) by mouth daily.   30 tablet   0   . predniSONE (DELTASONE) 10 MG tablet   Oral   Take 2 tablets (20 mg total) by mouth daily.   15 tablet   0   . sulfamethoxazole-trimethoprim (SEPTRA DS) 800-160 MG per tablet   Oral   Take 1 tablet by mouth 2 (two) times daily.   20 tablet   0     BP 139/84  Pulse 112  Temp(Src) 98.1 F (36.7 C) (Oral)  Resp 18  Ht 5\' 2"  (1.575 m)  Wt 180 lb (81.647 kg)  BMI 32.91 kg/m2  SpO2 98%  Physical Exam  Nursing note and vitals reviewed. Constitutional: She is oriented to person, place, and time. She appears well-developed and well-nourished.  HENT:  Head: Normocephalic and atraumatic.  Eyes: EOM are normal. Pupils are equal, round, and reactive to light.  Neck: Normal range of motion. Neck supple.  Cardiovascular: Normal rate, normal heart  sounds and intact distal pulses.   Pulmonary/Chest: Effort normal and breath sounds normal.  Abdominal: Bowel sounds are normal. She exhibits no distension. There is no tenderness.  Musculoskeletal: Normal range of motion. She exhibits no edema and no tenderness.  Neurological: She is alert and oriented to person, place, and time. She has normal strength. No cranial nerve deficit or sensory deficit.  Skin: Skin is warm and dry. No rash noted.  Psychiatric: She has a normal mood and affect.    ED Course  Procedures (including critical care time)  Labs Reviewed - No data to display No results found.   No diagnosis found.    MDM  Headache resolved with Toradol and Reglan, pt ready to go home.         Charles B. Bernette Mayers, MD 01/22/13 2030

## 2013-01-22 NOTE — ED Notes (Signed)
Pt reports mha since yesterday - taking maintenence meds without relief- vomited x 3

## 2013-03-26 ENCOUNTER — Emergency Department (HOSPITAL_BASED_OUTPATIENT_CLINIC_OR_DEPARTMENT_OTHER)
Admission: EM | Admit: 2013-03-26 | Discharge: 2013-03-26 | Disposition: A | Discharge status: Home / Self Care | Payer: BC Managed Care – PPO | Attending: Emergency Medicine | Admitting: Emergency Medicine

## 2013-03-26 ENCOUNTER — Encounter (HOSPITAL_BASED_OUTPATIENT_CLINIC_OR_DEPARTMENT_OTHER): Payer: Self-pay | Admitting: *Deleted

## 2013-03-26 DIAGNOSIS — G43909 Migraine, unspecified, not intractable, without status migrainosus: Secondary | ICD-10-CM | POA: Insufficient documentation

## 2013-03-26 DIAGNOSIS — I10 Essential (primary) hypertension: Secondary | ICD-10-CM | POA: Insufficient documentation

## 2013-03-26 DIAGNOSIS — J45909 Unspecified asthma, uncomplicated: Secondary | ICD-10-CM | POA: Insufficient documentation

## 2013-03-26 DIAGNOSIS — M549 Dorsalgia, unspecified: Secondary | ICD-10-CM | POA: Insufficient documentation

## 2013-03-26 DIAGNOSIS — Z79899 Other long term (current) drug therapy: Secondary | ICD-10-CM | POA: Insufficient documentation

## 2013-03-26 LAB — URINALYSIS, ROUTINE W REFLEX MICROSCOPIC
Glucose, UA: NEGATIVE mg/dL
Hgb urine dipstick: NEGATIVE
Ketones, ur: NEGATIVE mg/dL
Protein, ur: NEGATIVE mg/dL
Urobilinogen, UA: 0.2 mg/dL (ref 0.0–1.0)

## 2013-03-26 MED ORDER — CYCLOBENZAPRINE HCL 5 MG PO TABS: 5.0000 mg | ORAL_TABLET | Freq: Two times a day (BID) | ORAL | Status: DC | PRN

## 2013-03-26 MED ORDER — HYDROCODONE-ACETAMINOPHEN 5-325 MG PO TABS: 2.0000 | ORAL_TABLET | ORAL | Status: DC | PRN

## 2013-03-26 NOTE — ED Provider Notes (Signed)
History     CSN: [id number]  Arrival date & time 03/26/13  1338   First MD Initiated Contact with Patient 03/26/13 1347      Chief Complaint  Patient presents with  . Back Pain    (Consider location/radiation/quality/duration/timing/severity/associated sxs/prior treatment) HPI Comments: Pt states that she did some work in the yard and she has disk problems and now she is having spasms;pt states that she has a history of similar problems  Patient is a 47 y.o. female presenting with back pain. The history is provided by the patient. No language interpreter was used.  Back Pain Location:  Generalized Quality:  Aching Radiates to:  Does not radiate Pain severity:  Moderate Pain is:  Same all the time Onset quality:  Unable to specify Relieved by:  Nothing Worsened by:  Nothing tried   Past Medical History  Diagnosis Date  . Hypertension   . Migraine   . Asthma     Past Surgical History  Procedure Laterality Date  . Cholecystectomy    . Abdominal hysterectomy    . Appendectomy      Family History  Problem Relation Age of Onset  . Hypertension Mother   . Diabetes Father   . Hypertension Father   . Heart attack Neg Hx     History  Substance Use Topics  . Smoking status: Never Smoker   . Smokeless tobacco: Never Used  . Alcohol Use: No    OB History   Grav Para Term Preterm Abortions TAB SAB Ect Mult Living                  Review of Systems  Respiratory: Negative.   Cardiovascular: Negative.   Musculoskeletal: Positive for back pain.    Allergies  Review of patient's allergies indicates no known allergies.  Home Medications   Current Outpatient Rx  Name  Route  Sig  Dispense  Refill  . EXPIRED: albuterol (PROVENTIL HFA;VENTOLIN HFA) 108 (90 BASE) MCG/ACT inhaler   Inhalation   Inhale 2 puffs into the lungs every 4 (four) hours as needed for wheezing.   1 Inhaler   0   . b complex vitamins tablet   Oral   Take 1 tablet by mouth daily.          . cyclobenzaprine (FLEXERIL) 5 MG tablet   Oral   Take 1 tablet (5 mg total) by mouth 2 (two) times daily as needed for muscle spasms.   20 tablet   0   . enalapril (VASOTEC) 5 MG tablet   Oral   Take 5 mg by mouth daily.         Marland Kitchen HYDROcodone-acetaminophen (NORCO/VICODIN) 5-325 MG per tablet   Oral   Take 2 tablets by mouth every 4 (four) hours as needed for pain.   15 tablet   0   . lisinopril-hydrochlorothiazide (PRINZIDE,ZESTORETIC) 10-12.5 MG per tablet   Oral   Take 1 tablet by mouth daily.         . Multiple Vitamin (MULITIVITAMIN WITH MINERALS) TABS   Oral   Take 1 tablet by mouth daily.         . promethazine (PHENERGAN) 25 MG tablet   Oral   Take 1 tablet (25 mg total) by mouth every 6 (six) hours as needed for nausea.   30 tablet   0   . topiramate (TOPAMAX) 100 MG tablet   Oral   Take 100 mg by mouth 2 (two) times daily.  BP 119/67  Pulse 101  Temp(Src) 99 F (37.2 C) (Oral)  Resp 16  Wt 180 lb (81.647 kg)  BMI 32.91 kg/m2  SpO2 100%  Physical Exam  Nursing note and vitals reviewed. Constitutional: She is oriented to person, place, and time. She appears well-developed and well-nourished.  HENT:  Head: Normocephalic.  Cardiovascular: Normal rate and regular rhythm.   Pulmonary/Chest: Effort normal and breath sounds normal.  Musculoskeletal:  Cervical and thoracic paraspinal tenderness:moves all extremities without any problem  Neurological: She is alert and oriented to person, place, and time.  Skin: Skin is warm.  Psychiatric: She has a normal mood and affect.    ED Course  Procedures (including critical care time)  Labs Reviewed  URINALYSIS, ROUTINE W REFLEX MICROSCOPIC - Abnormal; Notable for the following:    APPearance CLOUDY (*)    All other components within normal limits   No results found.   1. Back pain       MDM  Pt not having any neuro deficits:will treat symptomatically with vicodin and  flexeril       Teressa Lower, NP 03/26/13 1810

## 2013-03-26 NOTE — ED Notes (Addendum)
Mid back pain with radiation into her neck. Feels like back spasms. Drove herself here.

## 2013-03-28 NOTE — ED Provider Notes (Signed)
Medical screening examination/treatment/procedure(s) were performed by non-physician practitioner and as supervising physician I was immediately available for consultation/collaboration.  Geoffery Lyons, MD 03/28/13 [phone]

## 2013-04-02 ENCOUNTER — Encounter: Payer: Self-pay | Admitting: Family Medicine

## 2013-04-02 ENCOUNTER — Ambulatory Visit (INDEPENDENT_AMBULATORY_CARE_PROVIDER_SITE_OTHER): Payer: BC Managed Care – PPO | Admitting: Family Medicine

## 2013-04-02 VITALS — BP 112/77 | HR 109 | Ht 63.0 in | Wt 175.0 lb

## 2013-04-02 DIAGNOSIS — M5412 Radiculopathy, cervical region: Secondary | ICD-10-CM

## 2013-04-02 DIAGNOSIS — M542 Cervicalgia: Secondary | ICD-10-CM

## 2013-04-02 DIAGNOSIS — M5010 Cervical disc disorder with radiculopathy, unspecified cervical region: Secondary | ICD-10-CM

## 2013-04-02 MED ORDER — CYCLOBENZAPRINE HCL 5 MG PO TABS: 5.0000 mg | ORAL_TABLET | Freq: Two times a day (BID) | ORAL | Status: DC | PRN

## 2013-04-02 MED ORDER — HYDROCODONE-ACETAMINOPHEN 5-325 MG PO TABS: 1.0000 | ORAL_TABLET | Freq: Four times a day (QID) | ORAL | Status: DC | PRN

## 2013-04-02 MED ORDER — PREDNISONE (PAK) 10 MG PO TABS: ORAL_TABLET | ORAL | Status: DC

## 2013-04-02 NOTE — Assessment & Plan Note (Signed)
most consistent with cervical radiculopathy, muscle spasms secondary to this.  ROI sent to obtain records.  Start with prednisone dose pack, flexeril and norco as needed.  Hold physical therapy for now until I review records.  Has had an extensive workup including x-rays, MRI and treatment that included prior PT, ESIs, gabapentin.

## 2013-04-02 NOTE — Progress Notes (Signed)
Subjective:    Patient ID: Veronica Braun, female    DOB: [birth year], 47 y.o.   MRN: [id number]  PCP: Dr. Arlyce Harman  HPI 47 yo F here for neck pain.  Patient reports she's had years of neck, upper back pain. Started approximately 5 years ago - was pushing a med cart when she felt pain in middle of thoracic spine area that radiates up to neck, down right arm at times. Sometimes with numbness and tingling. She's had an extensive workup and treatment. This includes gabapentin, flexeril, nsaids (irritate her stomach), prednisone, physical therapy (made her worse per her report), ESIs, MRI. Do not have access to her radiographs or MRI reports. Difficulty sleeping. No bowel/bladder dysfunction.  Past Medical History  Diagnosis Date  . Hypertension   . Migraine   . Asthma     Current Outpatient Prescriptions on File Prior to Visit  Medication Sig Dispense Refill  . albuterol (PROVENTIL HFA;VENTOLIN HFA) 108 (90 BASE) MCG/ACT inhaler Inhale 2 puffs into the lungs every 4 (four) hours as needed for wheezing.  1 Inhaler  0  . b complex vitamins tablet Take 1 tablet by mouth daily.      . enalapril (VASOTEC) 5 MG tablet Take 5 mg by mouth daily.      . Multiple Vitamin (MULITIVITAMIN WITH MINERALS) TABS Take 1 tablet by mouth daily.      . promethazine (PHENERGAN) 25 MG tablet Take 1 tablet (25 mg total) by mouth every 6 (six) hours as needed for nausea.  30 tablet  0  . topiramate (TOPAMAX) 100 MG tablet Take 100 mg by mouth 2 (two) times daily.       No current facility-administered medications on file prior to visit.    Past Surgical History  Procedure Laterality Date  . Cholecystectomy    . Abdominal hysterectomy    . Appendectomy      No Known Allergies  History   Social History  . Marital Status: Single    Spouse Name: N/A    Number of Children: N/A  . Years of Education: N/A   Occupational History  . Not on file.   Social History Main Topics  . Smoking status: Never  Smoker   . Smokeless tobacco: Never Used  . Alcohol Use: No  . Drug Use: No  . Sexually Active: Yes    Birth Control/ Protection: Surgical   Other Topics Concern  . Not on file   Social History Narrative  . No narrative on file    Family History  Problem Relation Age of Onset  . Hypertension Mother   . Diabetes Father   . Hypertension Father   . Heart attack Neg Hx     BP 112/77  Pulse 109  Ht 5\' 3"  (1.6 m)  Wt 175 lb (79.379 kg)  BMI 31.01 kg/m2  Review of Systems See HPI above.    Objective:   Physical Exam Gen: NAD  Neck/Upper Back: No gross deformity, swelling, bruising.  Mild spasms right paraspinal cervical region. TTP right cervical and thoracic paraspinal regions.  No midline, left sided, other tenderness. FROM neck - pain on flexion and right lateral rotation. Strength 4+/5 with triceps extension bilaterally.  Otherwise 5/5 all other muscle groups. Sensation intact to light touch.   2+ equal reflexes in triceps, biceps, 2+ brachioradialis tendons. Negative spurlings. NV intact distal BUEs.    Assessment & Plan:  1. Neck/Upper back pain - most consistent with cervical radiculopathy, muscle spasms secondary to this.  ROI sent to obtain records.  Start with prednisone dose pack, flexeril and norco as needed.  Hold physical therapy for now until I review records.  Has had an extensive workup including x-rays, MRI and treatment that included prior PT, ESIs, gabapentin.

## 2013-04-02 NOTE — Patient Instructions (Addendum)
You have cervical radiculopathy (a pinched nerve in the neck). Prednisone 6 day dose pack Flexeril three times a day as needed for muscle spasms (can make you sleepy - if so do not drive while taking this). Norco as needed for severe pain (no driving on this medicine). Simple range of motion exercises within limits of pain to prevent further stiffness. Consider physical therapy for stretching, exercises, traction, and modalities. We will get a copy of your prior records and discuss next steps. Heat 15 minutes at a time 3-4 times a day to help with spasms. Watch head position when on computers, texting, when sleeping in bed - should in line with back to prevent further nerve traction and irritation.

## 2013-07-14 ENCOUNTER — Emergency Department (HOSPITAL_BASED_OUTPATIENT_CLINIC_OR_DEPARTMENT_OTHER): Payer: BC Managed Care – PPO

## 2013-07-14 ENCOUNTER — Emergency Department (HOSPITAL_BASED_OUTPATIENT_CLINIC_OR_DEPARTMENT_OTHER)
Admission: EM | Admit: 2013-07-14 | Discharge: 2013-07-14 | Disposition: A | Discharge status: Home / Self Care | Payer: BC Managed Care – PPO | Attending: Emergency Medicine | Admitting: Emergency Medicine

## 2013-07-14 ENCOUNTER — Encounter (HOSPITAL_BASED_OUTPATIENT_CLINIC_OR_DEPARTMENT_OTHER): Payer: Self-pay | Admitting: *Deleted

## 2013-07-14 DIAGNOSIS — IMO0002 Reserved for concepts with insufficient information to code with codable children: Secondary | ICD-10-CM | POA: Insufficient documentation

## 2013-07-14 DIAGNOSIS — S335XXA Sprain of ligaments of lumbar spine, initial encounter: Secondary | ICD-10-CM | POA: Insufficient documentation

## 2013-07-14 DIAGNOSIS — M5010 Cervical disc disorder with radiculopathy, unspecified cervical region: Secondary | ICD-10-CM

## 2013-07-14 DIAGNOSIS — Y939 Activity, unspecified: Secondary | ICD-10-CM | POA: Insufficient documentation

## 2013-07-14 DIAGNOSIS — S39012A Strain of muscle, fascia and tendon of lower back, initial encounter: Secondary | ICD-10-CM

## 2013-07-14 DIAGNOSIS — J45909 Unspecified asthma, uncomplicated: Secondary | ICD-10-CM | POA: Insufficient documentation

## 2013-07-14 DIAGNOSIS — I10 Essential (primary) hypertension: Secondary | ICD-10-CM | POA: Insufficient documentation

## 2013-07-14 DIAGNOSIS — Z79899 Other long term (current) drug therapy: Secondary | ICD-10-CM | POA: Insufficient documentation

## 2013-07-14 DIAGNOSIS — Z87440 Personal history of urinary (tract) infections: Secondary | ICD-10-CM | POA: Insufficient documentation

## 2013-07-14 DIAGNOSIS — G43909 Migraine, unspecified, not intractable, without status migrainosus: Secondary | ICD-10-CM | POA: Insufficient documentation

## 2013-07-14 DIAGNOSIS — X58XXXA Exposure to other specified factors, initial encounter: Secondary | ICD-10-CM | POA: Insufficient documentation

## 2013-07-14 DIAGNOSIS — Y929 Unspecified place or not applicable: Secondary | ICD-10-CM | POA: Insufficient documentation

## 2013-07-14 MED ORDER — PREDNISONE (PAK) 10 MG PO TABS: ORAL_TABLET | ORAL | Status: DC

## 2013-07-14 MED ORDER — KETOROLAC TROMETHAMINE 60 MG/2ML IM SOLN
60.0000 mg | Freq: Once | INTRAMUSCULAR | Status: AC
  Administered 2013-07-14: 60 mg via INTRAMUSCULAR
  Filled 2013-07-14: qty 2

## 2013-07-14 MED ORDER — ONDANSETRON HCL 4 MG/2ML IJ SOLN
4.0000 mg | Freq: Once | INTRAMUSCULAR | Status: AC
  Administered 2013-07-14: 4 mg via INTRAMUSCULAR
  Filled 2013-07-14: qty 2

## 2013-07-14 MED ORDER — HYDROMORPHONE HCL PF 2 MG/ML IJ SOLN
2.0000 mg | Freq: Once | INTRAMUSCULAR | Status: AC
  Administered 2013-07-14: 2 mg via INTRAMUSCULAR
  Filled 2013-07-14: qty 1

## 2013-07-14 MED ORDER — METHOCARBAMOL 500 MG PO TABS: 500.0000 mg | ORAL_TABLET | Freq: Two times a day (BID) | ORAL | Status: DC

## 2013-07-14 MED ORDER — HYDROCODONE-ACETAMINOPHEN 5-325 MG PO TABS: 2.0000 | ORAL_TABLET | ORAL | Status: DC | PRN

## 2013-07-14 NOTE — ED Provider Notes (Signed)
CSN: [id number]     Arrival date & time 07/14/13  1337 History   First MD Initiated Contact with Patient 07/14/13 1433     Chief Complaint  Patient presents with  . Back Pain   (Consider location/radiation/quality/duration/timing/severity/associated sxs/prior Treatment) Patient is a 47 y.o. female presenting with back pain. The history is provided by the patient. No language interpreter was used.  Back Pain Location:  Lumbar spine Quality:  Aching Radiates to:  R thigh Pain severity:  Moderate Pain is:  Same all the time Duration:  2 weeks Timing:  Constant Progression:  Worsening Chronicity:  New Relieved by:  Nothing Worsened by:  Nothing tried Pt seen at [facility] ED 2 weeks ago.  Pt reports she was treated for a uti.   Pt request an xray of her back.   Past Medical History  Diagnosis Date  . Hypertension   . Migraine   . Asthma    Past Surgical History  Procedure Laterality Date  . Cholecystectomy    . Abdominal hysterectomy    . Appendectomy     Family History  Problem Relation Age of Onset  . Hypertension Mother   . Diabetes Father   . Hypertension Father   . Heart attack Neg Hx    History  Substance Use Topics  . Smoking status: Never Smoker   . Smokeless tobacco: Never Used  . Alcohol Use: No   OB History   Grav Para Term Preterm Abortions TAB SAB Ect Mult Living                 Review of Systems  Musculoskeletal: Positive for back pain.  All other systems reviewed and are negative.    Allergies  Review of patient's allergies indicates no known allergies.  Home Medications   Current Outpatient Rx  Name  Route  Sig  Dispense  Refill  . gabapentin (NEURONTIN) 300 MG capsule   Oral   Take 300 mg by mouth at bedtime.         Marland Kitchen lisinopril-hydrochlorothiazide (PRINZIDE,ZESTORETIC) 10-12.5 MG per tablet   Oral   Take 1 tablet by mouth daily.         Marland Kitchen EXPIRED: albuterol (PROVENTIL HFA;VENTOLIN HFA) 108 (90 BASE) MCG/ACT inhaler  Inhalation   Inhale 2 puffs into the lungs every 4 (four) hours as needed for wheezing.   1 Inhaler   0   . b complex vitamins tablet   Oral   Take 1 tablet by mouth daily.         . cyclobenzaprine (FLEXERIL) 5 MG tablet   Oral   Take 1 tablet (5 mg total) by mouth 2 (two) times daily as needed for muscle spasms.   90 tablet   1   . enalapril (VASOTEC) 5 MG tablet   Oral   Take 5 mg by mouth daily.         Marland Kitchen HYDROcodone-acetaminophen (NORCO/VICODIN) 5-325 MG per tablet   Oral   Take 1 tablet by mouth every 6 (six) hours as needed for pain.   60 tablet   0   . Multiple Vitamin (MULITIVITAMIN WITH MINERALS) TABS   Oral   Take 1 tablet by mouth daily.         . predniSONE (STERAPRED UNI-PAK) 10 MG tablet      6 tabs po day 1, 5 tabs po day 2, 4 tabs po day 3, 3 tabs po day 4, 2 tabs po day 5, 1 tab po  day 6   21 tablet   0   . promethazine (PHENERGAN) 25 MG tablet   Oral   Take 1 tablet (25 mg total) by mouth every 6 (six) hours as needed for nausea.   30 tablet   0   . topiramate (TOPAMAX) 100 MG tablet   Oral   Take 100 mg by mouth 2 (two) times daily.          BP 137/98  Pulse 104  Temp(Src) 98.2 F (36.8 C) (Oral)  Resp 20  Ht 5\' 2"  (1.575 m)  Wt 173 lb (78.472 kg)  BMI 31.63 kg/m2  SpO2 99% Physical Exam  Nursing note and vitals reviewed. Constitutional: She is oriented to person, place, and time. She appears well-developed and well-nourished.  Eyes: Pupils are equal, round, and reactive to light.  Neck: Normal range of motion.  Cardiovascular: Normal rate and normal heart sounds.   Pulmonary/Chest: Effort normal.  Abdominal: Soft.  Musculoskeletal: She exhibits tenderness.  Tender lumbar spine  Neurological: She is alert and oriented to person, place, and time. She has normal reflexes.  Skin: Skin is warm.  Psychiatric: She has a normal mood and affect.    ED Course  Procedures (including critical care time) Labs Review Labs  Reviewed - No data to display Imaging Review No results found.  MDM   1. Lumbar strain, initial encounter   2. Cervical disc disorder with radiculopathy of cervical region    Xray shows degenerative changes at multi levels.    Pt given rx for prednisone taper, robaxin and Hydrocodone.   Pt advised to follow up with Dr. Shon Baton for evaluation if pain persist.   Elson Areas, PA-C 07/14/13 1551

## 2013-07-14 NOTE — ED Notes (Signed)
Patient with history of low back pain for about four weeks.  Patient to [facility] about two weeks ago and was treated for UTI.  Patient was placed on Septra and has two days left of medications.  Patient denies any back injury.

## 2013-07-15 NOTE — ED Provider Notes (Signed)
Medical screening examination/treatment/procedure(s) were performed by non-physician practitioner and as supervising physician I was immediately available for consultation/collaboration.   Claudean Kinds, MD 07/15/13 [phone]

## 2013-11-14 ENCOUNTER — Emergency Department (HOSPITAL_BASED_OUTPATIENT_CLINIC_OR_DEPARTMENT_OTHER)
Admission: EM | Admit: 2013-11-14 | Discharge: 2013-11-14 | Disposition: A | Discharge status: Home / Self Care | Payer: BC Managed Care – PPO | Attending: Emergency Medicine | Admitting: Emergency Medicine

## 2013-11-14 ENCOUNTER — Encounter (HOSPITAL_BASED_OUTPATIENT_CLINIC_OR_DEPARTMENT_OTHER): Payer: Self-pay | Admitting: Emergency Medicine

## 2013-11-14 ENCOUNTER — Emergency Department (HOSPITAL_BASED_OUTPATIENT_CLINIC_OR_DEPARTMENT_OTHER): Payer: BC Managed Care – PPO

## 2013-11-14 DIAGNOSIS — Z79899 Other long term (current) drug therapy: Secondary | ICD-10-CM | POA: Insufficient documentation

## 2013-11-14 DIAGNOSIS — I10 Essential (primary) hypertension: Secondary | ICD-10-CM | POA: Insufficient documentation

## 2013-11-14 DIAGNOSIS — G43909 Migraine, unspecified, not intractable, without status migrainosus: Secondary | ICD-10-CM | POA: Insufficient documentation

## 2013-11-14 DIAGNOSIS — J45901 Unspecified asthma with (acute) exacerbation: Secondary | ICD-10-CM | POA: Insufficient documentation

## 2013-11-14 DIAGNOSIS — J069 Acute upper respiratory infection, unspecified: Secondary | ICD-10-CM | POA: Insufficient documentation

## 2013-11-14 MED ORDER — FEXOFENADINE HCL 60 MG PO TABS: 60.0000 mg | ORAL_TABLET | Freq: Two times a day (BID) | ORAL | Status: DC

## 2013-11-14 MED ORDER — FEXOFENADINE-PSEUDOEPHED ER 60-120 MG PO TB12: 1.0000 | ORAL_TABLET | Freq: Two times a day (BID) | ORAL | Status: DC

## 2013-11-14 MED ORDER — ALBUTEROL SULFATE HFA 108 (90 BASE) MCG/ACT IN AERS
2.0000 | INHALATION_SPRAY | Freq: Once | RESPIRATORY_TRACT | Status: AC
  Administered 2013-11-14: 2 via RESPIRATORY_TRACT
  Filled 2013-11-14: qty 6.7

## 2013-11-14 NOTE — ED Provider Notes (Signed)
CSN: [id number]     Arrival date & time 11/14/13  1639 History  This chart was scribed for Veronica Bucco, MD by Blanchard Kelch, ED Scribe. The patient was seen in room MH07/MH07. Patient's care was started at 7:34 PM.    Chief Complaint  Patient presents with  . URI    The history is provided by the patient. No language interpreter was used.    HPI Comments: Veronica Braun is a 48 y.o. female with a history of asthma who presents to the Emergency Department complaining of a constant, unchanged upper respiratory infection that began a few days ago. Associated symptoms include congestion, sneezing, post nasal drainage, cough, center chest wall pain and shortness of breath. The nasal congestion is worst at night. She has been using a nasal spray without relief. The chest pain and shortness of breath are exacerbated by walking. She denies fever, vomiting, diarrhea. She has also had problems with her ear itching recently, but was seen by her ENT doctor for the problem, who prescribed drops that she has been placing in her ear.  She denies smoking.   Past Medical History  Diagnosis Date  . Hypertension   . Migraine   . Asthma    Past Surgical History  Procedure Laterality Date  . Cholecystectomy    . Abdominal hysterectomy    . Appendectomy     Family History  Problem Relation Age of Onset  . Hypertension Mother   . Diabetes Father   . Hypertension Father   . Heart attack Neg Hx    History  Substance Use Topics  . Smoking status: Never Smoker   . Smokeless tobacco: Never Used  . Alcohol Use: No   OB History   Grav Para Term Preterm Abortions TAB SAB Ect Mult Living                 Review of Systems  Constitutional: Negative for fever, chills, diaphoresis and fatigue.  HENT: Positive for congestion and sneezing.   Eyes: Negative.   Respiratory: Positive for cough and shortness of breath. Negative for chest tightness.   Cardiovascular: Positive for chest pain. Negative for leg  swelling.  Gastrointestinal: Negative for nausea, vomiting, abdominal pain, diarrhea and blood in stool.  Genitourinary: Negative for frequency, hematuria, flank pain and difficulty urinating.  Musculoskeletal: Negative for arthralgias and back pain.  Skin: Negative for rash.  Neurological: Negative for dizziness, speech difficulty, weakness, numbness and headaches.    Allergies  Review of patient's allergies indicates no known allergies.  Home Medications   Current Outpatient Rx  Name  Route  Sig  Dispense  Refill  . EXPIRED: albuterol (PROVENTIL HFA;VENTOLIN HFA) 108 (90 BASE) MCG/ACT inhaler   Inhalation   Inhale 2 puffs into the lungs every 4 (four) hours as needed for wheezing.   1 Inhaler   0   . b complex vitamins tablet   Oral   Take 1 tablet by mouth daily.         . cyclobenzaprine (FLEXERIL) 5 MG tablet   Oral   Take 1 tablet (5 mg total) by mouth 2 (two) times daily as needed for muscle spasms.   90 tablet   1   . enalapril (VASOTEC) 5 MG tablet   Oral   Take 5 mg by mouth daily.         . fexofenadine (ALLEGRA) 60 MG tablet   Oral   Take 1 tablet (60 mg total) by mouth 2 (two) times  daily.   30 tablet   0   . gabapentin (NEURONTIN) 300 MG capsule   Oral   Take 300 mg by mouth at bedtime.         Marland Kitchen. HYDROcodone-acetaminophen (NORCO/VICODIN) 5-325 MG per tablet   Oral   Take 1 tablet by mouth every 6 (six) hours as needed for pain.   60 tablet   0   . HYDROcodone-acetaminophen (NORCO/VICODIN) 5-325 MG per tablet   Oral   Take 2 tablets by mouth every 4 (four) hours as needed for pain.   20 tablet   0   . lisinopril-hydrochlorothiazide (PRINZIDE,ZESTORETIC) 10-12.5 MG per tablet   Oral   Take 1 tablet by mouth daily.         . methocarbamol (ROBAXIN) 500 MG tablet   Oral   Take 1 tablet (500 mg total) by mouth 2 (two) times daily.   20 tablet   0   . Multiple Vitamin (MULITIVITAMIN WITH MINERALS) TABS   Oral   Take 1 tablet by  mouth daily.         . predniSONE (STERAPRED UNI-PAK) 10 MG tablet      6 tabs po day 1, 5 tabs po day 2, 4 tabs po day 3, 3 tabs po day 4, 2 tabs po day 5, 1 tab po day 6   21 tablet   0   . promethazine (PHENERGAN) 25 MG tablet   Oral   Take 1 tablet (25 mg total) by mouth every 6 (six) hours as needed for nausea.   30 tablet   0   . topiramate (TOPAMAX) 100 MG tablet   Oral   Take 100 mg by mouth 2 (two) times daily.          Triage Vitals: BP 107/69  Pulse 103  Temp(Src) 99.4 F (37.4 C) (Oral)  Resp 16  Ht 5' 2.5" (1.588 m)  Wt 173 lb (78.472 kg)  BMI 31.12 kg/m2  SpO2 100%  Physical Exam  Constitutional: She is oriented to person, place, and time. She appears well-developed and well-nourished.  HENT:  Head: Normocephalic and atraumatic.  Eyes: Pupils are equal, round, and reactive to light.  Neck: Normal range of motion. Neck supple.  Cardiovascular: Normal rate, regular rhythm and normal heart sounds.   Pulmonary/Chest: Effort normal and breath sounds normal. No respiratory distress. She has no wheezes. She has no rales. She exhibits tenderness.  Reproducible pain in center of chest.   Abdominal: Soft. Bowel sounds are normal. There is no tenderness. There is no rebound and no guarding.  Musculoskeletal: Normal range of motion. She exhibits no edema.  Lymphadenopathy:    She has no cervical adenopathy.  Neurological: She is alert and oriented to person, place, and time.  Skin: Skin is warm and dry. No rash noted.  Psychiatric: She has a normal mood and affect.    ED Course  Procedures (including critical care time)  DIAGNOSTIC STUDIES: Oxygen Saturation is 100% on room air, normal by my interpretation.    COORDINATION OF CARE: 7:37 PM -Will order chest x-ray. Patient verbalizes understanding and agrees with treatment plan.    Labs Review Labs Reviewed - No data to display Imaging Review Dg Chest 2 View  11/14/2013   CLINICAL DATA:  Cough.   Congestion.  Fever.  Dyspnea.  EXAM: CHEST  2 VIEW  COMPARISON:  08/16/2012  FINDINGS: The heart size and mediastinal contours are within normal limits. Both lungs are clear. The visualized  skeletal structures are unremarkable.  IMPRESSION: No active cardiopulmonary disease.   Electronically Signed   By: Herbie Baltimore M.D.   On: 11/14/2013 19:53    EKG Interpretation    Date/Time:  Thursday November 14 2013 16:47:08 EST Ventricular Rate:  103 PR Interval:  170 QRS Duration: 76 QT Interval:  328 QTC Calculation: 429 R Axis:   72 Text Interpretation:  Sinus tachycardia Otherwise normal ECG since last tracing no significant change Confirmed by Valary Manahan  MD, Quita Mcgrory ([id number]) on 11/14/2013 8:08:45 PM            MDM   1. URI (upper respiratory infection)    Pt with URI symptoms.  CP reproducible on palpation.  No evidence of pneumonia/PTX on CXR.  HR 82, no persistent tachycardia.  sats 100%, no increased work of breathing.  She was given an albuterol MDI as well as a prescription for allegra. She was encouraged to follow with her primary care physician if her symptoms are not improving.  I personally performed the services described in this documentation, which was scribed in my presence.  The recorded information has been reviewed and considered.    Veronica Bucco, MD 11/14/13 2018

## 2013-11-14 NOTE — ED Notes (Addendum)
Cough non productive,, congestion runny nose x few days,  Chest soreness w cough diff breathing

## 2013-11-14 NOTE — Discharge Instructions (Signed)

## 2013-11-14 NOTE — ED Notes (Signed)
Cough, sinus drainage, sob and chest pain with the cough. Ran out the inhaler that we gave her on her last visit.

## 2013-12-25 ENCOUNTER — Encounter (HOSPITAL_BASED_OUTPATIENT_CLINIC_OR_DEPARTMENT_OTHER): Payer: Self-pay | Admitting: Emergency Medicine

## 2013-12-25 ENCOUNTER — Emergency Department (HOSPITAL_BASED_OUTPATIENT_CLINIC_OR_DEPARTMENT_OTHER): Payer: BC Managed Care – PPO

## 2013-12-25 ENCOUNTER — Emergency Department (HOSPITAL_BASED_OUTPATIENT_CLINIC_OR_DEPARTMENT_OTHER)
Admission: EM | Admit: 2013-12-25 | Discharge: 2013-12-25 | Disposition: A | Discharge status: Home / Self Care | Payer: BC Managed Care – PPO | Attending: Emergency Medicine | Admitting: Emergency Medicine

## 2013-12-25 DIAGNOSIS — Y939 Activity, unspecified: Secondary | ICD-10-CM | POA: Insufficient documentation

## 2013-12-25 DIAGNOSIS — Z79899 Other long term (current) drug therapy: Secondary | ICD-10-CM | POA: Insufficient documentation

## 2013-12-25 DIAGNOSIS — W010XXA Fall on same level from slipping, tripping and stumbling without subsequent striking against object, initial encounter: Secondary | ICD-10-CM | POA: Insufficient documentation

## 2013-12-25 DIAGNOSIS — I10 Essential (primary) hypertension: Secondary | ICD-10-CM | POA: Insufficient documentation

## 2013-12-25 DIAGNOSIS — G8929 Other chronic pain: Secondary | ICD-10-CM | POA: Insufficient documentation

## 2013-12-25 DIAGNOSIS — M545 Low back pain, unspecified: Secondary | ICD-10-CM

## 2013-12-25 DIAGNOSIS — G43909 Migraine, unspecified, not intractable, without status migrainosus: Secondary | ICD-10-CM | POA: Insufficient documentation

## 2013-12-25 DIAGNOSIS — IMO0002 Reserved for concepts with insufficient information to code with codable children: Secondary | ICD-10-CM | POA: Insufficient documentation

## 2013-12-25 DIAGNOSIS — J45909 Unspecified asthma, uncomplicated: Secondary | ICD-10-CM | POA: Insufficient documentation

## 2013-12-25 DIAGNOSIS — Z23 Encounter for immunization: Secondary | ICD-10-CM | POA: Insufficient documentation

## 2013-12-25 DIAGNOSIS — S8001XA Contusion of right knee, initial encounter: Secondary | ICD-10-CM

## 2013-12-25 DIAGNOSIS — Y929 Unspecified place or not applicable: Secondary | ICD-10-CM | POA: Insufficient documentation

## 2013-12-25 DIAGNOSIS — S8000XA Contusion of unspecified knee, initial encounter: Secondary | ICD-10-CM | POA: Insufficient documentation

## 2013-12-25 DIAGNOSIS — Z8739 Personal history of other diseases of the musculoskeletal system and connective tissue: Secondary | ICD-10-CM | POA: Insufficient documentation

## 2013-12-25 HISTORY — DX: Reserved for concepts with insufficient information to code with codable children: IMO0002

## 2013-12-25 MED ORDER — TETANUS-DIPHTH-ACELL PERTUSSIS 5-2.5-18.5 LF-MCG/0.5 IM SUSP
0.5000 mL | Freq: Once | INTRAMUSCULAR | Status: AC
  Administered 2013-12-25: 0.5 mL via INTRAMUSCULAR
  Filled 2013-12-25: qty 0.5

## 2013-12-25 NOTE — ED Notes (Signed)
Fell after stepping on "something round".  C/O right knee pain.

## 2013-12-25 NOTE — ED Provider Notes (Signed)
CSN: [id number]     Arrival date & time 12/25/13  [id number] History   First MD Initiated Contact with Patient 12/25/13 [phone]     Chief Complaint  Patient presents with  . Back Pain  . Knee Pain     (Consider location/radiation/quality/duration/timing/severity/associated sxs/prior Treatment) Patient is a 48 y.o. female presenting with back pain and knee pain.  Back Pain Knee Pain Associated symptoms: back pain    Pt with history of chronic back pain reports she slipped and fell this morning landing on her R knee, sustaining abrasion to anterior knee and also exacerbating her lower back pain. No head injury or LOC. Complaining of severe aching R knee pain and diffuse lower back pain, worse with movement and bearing weight.   Past Medical History  Diagnosis Date  . Hypertension   . Migraine   . Asthma   . DDD (degenerative disc disease)    Past Surgical History  Procedure Laterality Date  . Cholecystectomy    . Abdominal hysterectomy    . Appendectomy     Family History  Problem Relation Age of Onset  . Hypertension Mother   . Diabetes Father   . Hypertension Father   . Heart attack Neg Hx    History  Substance Use Topics  . Smoking status: Never Smoker   . Smokeless tobacco: Never Used  . Alcohol Use: Yes     Comment: occasional   OB History   Grav Para Term Preterm Abortions TAB SAB Ect Mult Living                 Review of Systems  Musculoskeletal: Positive for back pain.   All other systems reviewed and are negative except as noted in HPI.     Allergies  Review of patient's allergies indicates no known allergies.  Home Medications   Current Outpatient Rx  Name  Route  Sig  Dispense  Refill  . EXPIRED: albuterol (PROVENTIL HFA;VENTOLIN HFA) 108 (90 BASE) MCG/ACT inhaler   Inhalation   Inhale 2 puffs into the lungs every 4 (four) hours as needed for wheezing.   1 Inhaler   0   . b complex vitamins tablet   Oral   Take 1 tablet by mouth daily.         . cyclobenzaprine (FLEXERIL) 5 MG tablet   Oral   Take 1 tablet (5 mg total) by mouth 2 (two) times daily as needed for muscle spasms.   90 tablet   1   . enalapril (VASOTEC) 5 MG tablet   Oral   Take 5 mg by mouth daily.         . fexofenadine (ALLEGRA) 60 MG tablet   Oral   Take 1 tablet (60 mg total) by mouth 2 (two) times daily.   30 tablet   0   . gabapentin (NEURONTIN) 300 MG capsule   Oral   Take 300 mg by mouth at bedtime.         Marland Kitchen. HYDROcodone-acetaminophen (NORCO/VICODIN) 5-325 MG per tablet   Oral   Take 1 tablet by mouth every 6 (six) hours as needed for pain.   60 tablet   0   . HYDROcodone-acetaminophen (NORCO/VICODIN) 5-325 MG per tablet   Oral   Take 2 tablets by mouth every 4 (four) hours as needed for pain.   20 tablet   0   . lisinopril-hydrochlorothiazide (PRINZIDE,ZESTORETIC) 10-12.5 MG per tablet   Oral   Take 1 tablet by mouth  daily.         . methocarbamol (ROBAXIN) 500 MG tablet   Oral   Take 1 tablet (500 mg total) by mouth 2 (two) times daily.   20 tablet   0   . Multiple Vitamin (MULITIVITAMIN WITH MINERALS) TABS   Oral   Take 1 tablet by mouth daily.         . predniSONE (STERAPRED UNI-PAK) 10 MG tablet      6 tabs po day 1, 5 tabs po day 2, 4 tabs po day 3, 3 tabs po day 4, 2 tabs po day 5, 1 tab po day 6   21 tablet   0   . promethazine (PHENERGAN) 25 MG tablet   Oral   Take 1 tablet (25 mg total) by mouth every 6 (six) hours as needed for nausea.   30 tablet   0   . topiramate (TOPAMAX) 100 MG tablet   Oral   Take 100 mg by mouth 2 (two) times daily.          BP 128/74  Pulse 86  Temp(Src) 98.9 F (37.2 C) (Oral)  Resp 16  Ht 5\' 2"  (1.575 m)  Wt 170 lb (77.111 kg)  BMI 31.09 kg/m2  SpO2 100% Physical Exam  Nursing note and vitals reviewed. Constitutional: She is oriented to person, place, and time. She appears well-developed and well-nourished.  HENT:  Head: Normocephalic and atraumatic.  Eyes:  EOM are normal. Pupils are equal, round, and reactive to light.  Neck: Normal range of motion. Neck supple.  Cardiovascular: Normal rate, normal heart sounds and intact distal pulses.   Pulmonary/Chest: Effort normal and breath sounds normal.  Abdominal: Bowel sounds are normal. She exhibits no distension. There is no tenderness.  Musculoskeletal: Normal range of motion. She exhibits tenderness (diffuse lower back tenderness; tender palpation anterior R knee, no instability, no effusion). She exhibits no edema.  Neurological: She is alert and oriented to person, place, and time. She has normal strength. No cranial nerve deficit or sensory deficit.  Skin: Skin is warm and dry. No rash noted.  Abrasion over anterior R ankle and R knee  Psychiatric: She has a normal mood and affect.    ED Course  Procedures (including critical care time) Labs Review Labs Reviewed - No data to display Imaging Review Dg Knee Complete 4 Views Right  12/25/2013   CLINICAL DATA:  FALL KNEE PAIN ABRASION  EXAM: RIGHT KNEE - COMPLETE 4+ VIEW  COMPARISON:  None.  FINDINGS: There is no evidence of fracture, dislocation, or joint effusion. There is no evidence of arthropathy or other focal bone abnormality. Soft tissues are unremarkable.  IMPRESSION: Negative.   Electronically Signed   By: Salome Holmes M.D.   On: 12/25/2013 09:45    EKG Interpretation   None       MDM   Final diagnoses:  Low back pain  Contusion of right knee    Low suspicion for bony injury, will check xray of knee given pain with ROM. Pt has Tramadol at home from Dr. Shon Baton who manages her chronic back pain.     Jamear Carbonneau B. Bernette Mayers, MD 12/25/13 [phone]

## 2013-12-25 NOTE — Discharge Instructions (Signed)
Back Pain, Adult °Low back pain is very common. About 1 in 5 people have back pain. The cause of low back pain is rarely dangerous. The pain often gets better over time. About half of people with a sudden onset of back pain feel better in just 2 weeks. About 8 in 10 people feel better by 6 weeks.  °CAUSES °Some common causes of back pain include: °· Strain of the muscles or ligaments supporting the spine. °· Wear and tear (degeneration) of the spinal discs. °· Arthritis. °· Direct injury to the back. °DIAGNOSIS °Most of the time, the direct cause of low back pain is not known. However, back pain can be treated effectively even when the exact cause of the pain is unknown. Answering your caregiver's questions about your overall health and symptoms is one of the most accurate ways to make sure the cause of your pain is not dangerous. If your caregiver needs more information, he or she may order lab work or imaging tests (X-rays or MRIs). However, even if imaging tests show changes in your back, this usually does not require surgery. °HOME CARE INSTRUCTIONS °For many people, back pain returns. Since low back pain is rarely dangerous, it is often a condition that people can learn to manage on their own.  °· Remain active. It is stressful on the back to sit or stand in one place. Do not sit, drive, or stand in one place for more than 30 minutes at a time. Take short walks on level surfaces as soon as pain allows. Try to increase the length of time you walk each day. °· Do not stay in bed. Resting more than 1 or 2 days can delay your recovery. °· Do not avoid exercise or work. Your body is made to move. It is not dangerous to be active, even though your back may hurt. Your back will likely heal faster if you return to being active before your pain is gone. °· Pay attention to your body when you  bend and lift. Many people have less discomfort when lifting if they bend their knees, keep the load close to their bodies, and  avoid twisting. Often, the most comfortable positions are those that put less stress on your recovering back. °· Find a comfortable position to sleep. Use a firm mattress and lie on your side with your knees slightly bent. If you lie on your back, put a pillow under your knees. °· Only take over-the-counter or prescription medicines as directed by your caregiver. Over-the-counter medicines to reduce pain and inflammation are often the most helpful. Your caregiver may prescribe muscle relaxant drugs. These medicines help dull your pain so you can more quickly return to your normal activities and healthy exercise. °· Put ice on the injured area. °· Put ice in a plastic bag. °· Place a towel between your skin and the bag. °· Leave the ice on for 15-20 minutes, 03-04 times a day for the first 2 to 3 days. After that, ice and heat may be alternated to reduce pain and spasms. °· Ask your caregiver about trying back exercises and gentle massage. This may be of some benefit. °· Avoid feeling anxious or stressed. Stress increases muscle tension and can worsen back pain. It is important to recognize when you are anxious or stressed and learn ways to manage it. Exercise is a great option. °SEEK MEDICAL CARE IF: °· You have pain that is not relieved with rest or medicine. °· You have pain that does not improve in 1 week. °· You have new symptoms. °· You are generally not feeling well. °SEEK   IMMEDIATE MEDICAL CARE IF:  °· You have pain that radiates from your back into your legs. °· You develop new bowel or bladder control problems. °· You have unusual weakness or numbness in your arms or legs. °· You develop nausea or vomiting. °· You develop abdominal pain. °· You feel faint. °Document Released: 10/31/2005 Document Revised: 05/01/2012 Document Reviewed: 03/21/2011 °ExitCare® Patient Information ©2014 [employer]. ° °Contusion °A contusion is a deep bruise. Contusions are the result of an injury that caused bleeding under the  skin. The contusion may turn blue, purple, or yellow. Minor injuries will give you a painless contusion, but more severe contusions may stay painful and swollen for a few weeks.  °CAUSES  °A contusion is usually caused by a blow, trauma, or direct force to an area of the body. °SYMPTOMS  °· Swelling and redness of the injured area. °· Bruising of the injured area. °· Tenderness and soreness of the injured area. °· Pain. °DIAGNOSIS  °The diagnosis can be made by taking a history and physical exam. An X-ray, CT scan, or MRI may be needed to determine if there were any associated injuries, such as fractures. °TREATMENT  °Specific treatment will depend on what area of the body was injured. In general, the best treatment for a contusion is resting, icing, elevating, and applying cold compresses to the injured area. Over-the-counter medicines may also be recommended for pain control. Ask your caregiver what the best treatment is for your contusion. °HOME CARE INSTRUCTIONS  °· Put ice on the injured area. °· Put ice in a plastic bag. °· Place a towel between your skin and the bag. °· Leave the ice on for 15-20 minutes, 03-04 times a day. °· Only take over-the-counter or prescription medicines for pain, discomfort, or fever as directed by your caregiver. Your caregiver may recommend avoiding anti-inflammatory medicines (aspirin, ibuprofen, and naproxen) for 48 hours because these medicines may increase bruising. °· Rest the injured area. °· If possible, elevate the injured area to reduce swelling. °SEEK IMMEDIATE MEDICAL CARE IF:  °· You have increased bruising or swelling. °· You have pain that is getting worse. °· Your swelling or pain is not relieved with medicines. °MAKE SURE YOU:  °· Understand these instructions. °· Will watch your condition. °· Will get help right away if you are not doing well or get worse. °Document Released: 08/10/2005 Document Revised: 01/23/2012 Document Reviewed: 09/05/2011 °ExitCare® Patient  Information ©2014 [employer]. ° °

## 2013-12-25 NOTE — ED Notes (Signed)
Patient transported to X-ray 

## 2014-01-07 ENCOUNTER — Other Ambulatory Visit (HOSPITAL_COMMUNITY): Payer: Self-pay | Admitting: Orthopedic Surgery

## 2014-01-07 DIAGNOSIS — M25561 Pain in right knee: Secondary | ICD-10-CM

## 2014-01-17 ENCOUNTER — Ambulatory Visit (HOSPITAL_COMMUNITY)
Admission: RE | Admit: 2014-01-17 | Discharge: 2014-01-17 | Disposition: A | Discharge status: Home / Self Care | Payer: BC Managed Care – PPO | Source: Ambulatory Visit | Attending: Orthopedic Surgery | Admitting: Orthopedic Surgery

## 2014-01-17 DIAGNOSIS — M25569 Pain in unspecified knee: Secondary | ICD-10-CM | POA: Insufficient documentation

## 2014-01-17 DIAGNOSIS — X58XXXA Exposure to other specified factors, initial encounter: Secondary | ICD-10-CM | POA: Insufficient documentation

## 2014-01-17 DIAGNOSIS — IMO0002 Reserved for concepts with insufficient information to code with codable children: Secondary | ICD-10-CM | POA: Insufficient documentation

## 2014-01-17 DIAGNOSIS — S99929A Unspecified injury of unspecified foot, initial encounter: Secondary | ICD-10-CM

## 2014-01-17 DIAGNOSIS — S8990XA Unspecified injury of unspecified lower leg, initial encounter: Secondary | ICD-10-CM | POA: Insufficient documentation

## 2014-01-17 DIAGNOSIS — S99919A Unspecified injury of unspecified ankle, initial encounter: Secondary | ICD-10-CM

## 2014-01-17 DIAGNOSIS — M7120 Synovial cyst of popliteal space [Baker], unspecified knee: Secondary | ICD-10-CM | POA: Insufficient documentation

## 2014-01-17 DIAGNOSIS — M25561 Pain in right knee: Secondary | ICD-10-CM

## 2014-01-17 DIAGNOSIS — M25469 Effusion, unspecified knee: Secondary | ICD-10-CM | POA: Insufficient documentation

## 2014-05-14 ENCOUNTER — Encounter (HOSPITAL_BASED_OUTPATIENT_CLINIC_OR_DEPARTMENT_OTHER): Payer: Self-pay | Admitting: Emergency Medicine

## 2014-05-14 ENCOUNTER — Emergency Department (HOSPITAL_BASED_OUTPATIENT_CLINIC_OR_DEPARTMENT_OTHER)
Admission: EM | Admit: 2014-05-14 | Discharge: 2014-05-14 | Disposition: A | Discharge status: Home / Self Care | Payer: BC Managed Care – PPO | Attending: Emergency Medicine | Admitting: Emergency Medicine

## 2014-05-14 DIAGNOSIS — Z79899 Other long term (current) drug therapy: Secondary | ICD-10-CM | POA: Insufficient documentation

## 2014-05-14 DIAGNOSIS — R42 Dizziness and giddiness: Secondary | ICD-10-CM

## 2014-05-14 DIAGNOSIS — I10 Essential (primary) hypertension: Secondary | ICD-10-CM | POA: Insufficient documentation

## 2014-05-14 DIAGNOSIS — J45909 Unspecified asthma, uncomplicated: Secondary | ICD-10-CM | POA: Insufficient documentation

## 2014-05-14 DIAGNOSIS — R110 Nausea: Secondary | ICD-10-CM | POA: Insufficient documentation

## 2014-05-14 DIAGNOSIS — G43909 Migraine, unspecified, not intractable, without status migrainosus: Secondary | ICD-10-CM | POA: Insufficient documentation

## 2014-05-14 DIAGNOSIS — Z8739 Personal history of other diseases of the musculoskeletal system and connective tissue: Secondary | ICD-10-CM | POA: Insufficient documentation

## 2014-05-14 LAB — COMPREHENSIVE METABOLIC PANEL
ALT: 9 U/L (ref 0–35)
AST: 17 U/L (ref 0–37)
Albumin: 4.1 g/dL (ref 3.5–5.2)
Alkaline Phosphatase: 97 U/L (ref 39–117)
Anion gap: 14 (ref 5–15)
BUN: 6 mg/dL (ref 6–23)
CHLORIDE: 99 meq/L (ref 96–112)
CO2: 28 meq/L (ref 19–32)
Calcium: 9.8 mg/dL (ref 8.4–10.5)
Creatinine, Ser: 0.7 mg/dL (ref 0.50–1.10)
GFR calc Af Amer: >90 mL/min (ref >90)
Glucose, Bld: 97 mg/dL (ref 70–99)
Potassium: 3.3 mEq/L — ABNORMAL LOW (ref 3.7–5.3)
SODIUM: 141 meq/L (ref 137–147)
Total Bilirubin: 0.3 mg/dL (ref 0.3–1.2)
Total Protein: 7.8 g/dL (ref 6.0–8.3)

## 2014-05-14 LAB — URINALYSIS, ROUTINE W REFLEX MICROSCOPIC
Bilirubin Urine: NEGATIVE
GLUCOSE, UA: NEGATIVE mg/dL
HGB URINE DIPSTICK: NEGATIVE
Ketones, ur: NEGATIVE mg/dL
Leukocytes, UA: NEGATIVE
Nitrite: NEGATIVE
Protein, ur: NEGATIVE mg/dL
Specific Gravity, Urine: 1.008 (ref 1.005–1.030)
Urobilinogen, UA: 0.2 mg/dL (ref 0.0–1.0)
pH: 5.5 (ref 5.0–8.0)

## 2014-05-14 LAB — CBC WITH DIFFERENTIAL/PLATELET
Basophils Absolute: 0 10*3/uL (ref 0.0–0.1)
Basophils Relative: 0 % (ref 0–1)
Eosinophils Absolute: 0.1 10*3/uL (ref 0.0–0.7)
Eosinophils Relative: 1 % (ref 0–5)
HCT: 39.9 % (ref 36.0–46.0)
Hemoglobin: 13.3 g/dL (ref 12.0–15.0)
LYMPHS PCT: 46 % (ref 12–46)
Lymphs Abs: 2.9 10*3/uL (ref 0.7–4.0)
MCH: 29.3 pg (ref 26.0–34.0)
MCHC: 33.3 g/dL (ref 30.0–36.0)
MCV: 87.9 fL (ref 78.0–100.0)
Monocytes Absolute: 0.3 10*3/uL (ref 0.1–1.0)
Monocytes Relative: 5 % (ref 3–12)
NEUTROS ABS: 3 10*3/uL (ref 1.7–7.7)
NEUTROS PCT: 47 % (ref 43–77)
PLATELETS: 255 10*3/uL (ref 150–400)
RBC: 4.54 MIL/uL (ref 3.87–5.11)
RDW: 13.4 % (ref 11.5–15.5)
WBC: 6.4 10*3/uL (ref 4.0–10.5)

## 2014-05-14 MED ORDER — MECLIZINE HCL 25 MG PO TABS
25.0000 mg | ORAL_TABLET | Freq: Once | ORAL | Status: AC
  Administered 2014-05-14: 25 mg via ORAL
  Filled 2014-05-14: qty 1

## 2014-05-14 MED ORDER — MECLIZINE HCL 25 MG PO TABS: 25.0000 mg | ORAL_TABLET | Freq: Four times a day (QID) | ORAL | Status: DC

## 2014-05-14 MED ORDER — ONDANSETRON HCL 4 MG/2ML IJ SOLN
4.0000 mg | Freq: Once | INTRAMUSCULAR | Status: DC
  Filled 2014-05-14: qty 2

## 2014-05-14 MED ORDER — SODIUM CHLORIDE 0.9 % IV SOLN
Freq: Once | INTRAVENOUS | Status: AC
  Administered 2014-05-14: 15:00:00 via INTRAVENOUS

## 2014-05-14 NOTE — ED Notes (Signed)
Pt c/o " low blood pressure and dizziness "  X 1 day

## 2014-05-14 NOTE — ED Provider Notes (Signed)
CSN: [id number]     Arrival date & time 05/14/14  1317 History   First MD Initiated Contact with Patient 05/14/14 1332     Chief Complaint  Patient presents with  . Dizziness     (Consider location/radiation/quality/duration/timing/severity/associated sxs/prior Treatment) Patient is a 48 y.o. female presenting with dizziness. The history is provided by the patient. No language interpreter was used.  Dizziness Quality:  Lightheadedness Severity:  Moderate Onset quality:  Gradual Duration:  1 day Timing:  Constant Progression:  Worsening Chronicity:  New Relieved by:  Nothing Worsened by:  Nothing tried Ineffective treatments:  None tried Associated symptoms: nausea   Associated symptoms: no vomiting   Pt complains blood pressure has been low and she has felt dizzy today.   Pt has been fasting for Ramadan.   Pt admits weight loss.   Pt reports blood pressure is normally high.  Past Medical History  Diagnosis Date  . Hypertension   . Migraine   . Asthma   . DDD (degenerative disc disease)    Past Surgical History  Procedure Laterality Date  . Cholecystectomy    . Abdominal hysterectomy    . Appendectomy     Family History  Problem Relation Age of Onset  . Hypertension Mother   . Diabetes Father   . Hypertension Father   . Heart attack Neg Hx    History  Substance Use Topics  . Smoking status: Never Smoker   . Smokeless tobacco: Never Used  . Alcohol Use: Yes     Comment: occasional   OB History   Grav Para Term Preterm Abortions TAB SAB Ect Mult Living                 Review of Systems  Gastrointestinal: Positive for nausea. Negative for vomiting.  Neurological: Positive for dizziness.  All other systems reviewed and are negative.     Allergies  Review of patient's allergies indicates no known allergies.  Home Medications   Prior to Admission medications   Medication Sig Start Date End Date Taking? Authorizing Provider  albuterol (PROVENTIL  HFA;VENTOLIN HFA) 108 (90 BASE) MCG/ACT inhaler Inhale 2 puffs into the lungs every 4 (four) hours as needed for wheezing. 09/22/11 01/22/13  Glynn OctaveStephen Rancour, MD  b complex vitamins tablet Take 1 tablet by mouth daily.    Historical Provider, MD  enalapril (VASOTEC) 5 MG tablet Take 5 mg by mouth daily.    Historical Provider, MD  fexofenadine (ALLEGRA) 60 MG tablet Take 1 tablet (60 mg total) by mouth 2 (two) times daily. 11/14/13   Rolan BuccoMelanie Belfi, MD  gabapentin (NEURONTIN) 300 MG capsule Take 300 mg by mouth at bedtime.    Historical Provider, MD  lisinopril-hydrochlorothiazide (PRINZIDE,ZESTORETIC) 10-12.5 MG per tablet Take 1 tablet by mouth daily.    Historical Provider, MD  methocarbamol (ROBAXIN) 500 MG tablet Take 1 tablet (500 mg total) by mouth 2 (two) times daily. 07/14/13   Elson AreasLeslie K Athenia Rys, PA-C  Multiple Vitamin (MULITIVITAMIN WITH MINERALS) TABS Take 1 tablet by mouth daily.    Historical Provider, MD  topiramate (TOPAMAX) 100 MG tablet Take 100 mg by mouth 2 (two) times daily.    Historical Provider, MD   BP 121/64  Pulse 80  Temp(Src) 98.3 F (36.8 C)  Resp 16  Ht 5' 2.5" (1.588 m)  Wt 170 lb (77.111 kg)  BMI 30.58 kg/m2  SpO2 100% Physical Exam  Nursing note and vitals reviewed. Constitutional: She is oriented to person, place, and  time. She appears well-developed and well-nourished.  HENT:  Head: Normocephalic and atraumatic.  Right Ear: External ear normal.  Left Ear: External ear normal.  Eyes: Conjunctivae and EOM are normal. Pupils are equal, round, and reactive to light.  Neck: Normal range of motion.  Cardiovascular: Normal rate and normal heart sounds.   Pulmonary/Chest: Effort normal.  Abdominal: Soft. She exhibits no distension.  Musculoskeletal: Normal range of motion.  Neurological: She is alert and oriented to person, place, and time.  Skin: Skin is warm.  Psychiatric: She has a normal mood and affect.    ED Course  Procedures (including critical care  time) Labs Review Labs Reviewed - No data to display  Imaging Review No results found.   EKG Interpretation None      MDM   Final diagnoses:  Vertigo    Pt feels better after fluids and antivert.   I advised vitamin with potassium.   Pt advised to see her Md for recheck.   Pt counseled on fluid and po intake.       Lonia SkinnerLeslie K PalmarejoSofia, PA-C 05/14/14 [phone]

## 2014-05-14 NOTE — Discharge Instructions (Signed)
Benign Positional Vertigo Vertigo means you feel like you or your surroundings are moving when they are not. Benign positional vertigo is the most common form of vertigo. Benign means that the cause of your condition is not serious. Benign positional vertigo is more common in older adults. CAUSES  Benign positional vertigo is the result of an upset in the labyrinth system. This is an area in the middle ear that helps control your balance. This may be caused by a viral infection, head injury, or repetitive motion. However, often no specific cause is found. SYMPTOMS  Symptoms of benign positional vertigo occur when you move your head or eyes in different directions. Some of the symptoms may include:  Loss of balance and falls.  Vomiting.  Blurred vision.  Dizziness.  Nausea.  Involuntary eye movements (nystagmus). DIAGNOSIS  Benign positional vertigo is usually diagnosed by physical exam. If the specific cause of your benign positional vertigo is unknown, your caregiver may perform imaging tests, such as magnetic resonance imaging (MRI) or computed tomography (CT). TREATMENT  Your caregiver may recommend movements or procedures to correct the benign positional vertigo. Medicines such as meclizine, benzodiazepines, and medicines for nausea may be used to treat your symptoms. In rare cases, if your symptoms are caused by certain conditions that affect the inner ear, you may need surgery. HOME CARE INSTRUCTIONS   Follow your caregiver's instructions.  Move slowly. Do not make sudden body or head movements.  Avoid driving.  Avoid operating heavy machinery.  Avoid performing any tasks that would be dangerous to you or others during a vertigo episode.  Drink enough fluids to keep your urine clear or pale yellow. SEEK IMMEDIATE MEDICAL CARE IF:   You develop problems with walking, weakness, numbness, or using your arms, hands, or legs.  You have difficulty speaking.  You develop  severe headaches.  Your nausea or vomiting continues or gets worse.  You develop visual changes.  Your family or friends notice any behavioral changes.  Your condition gets worse.  You have a fever.  You develop a stiff neck or sensitivity to light. MAKE SURE YOU:   Understand these instructions.  Will watch your condition.  Will get help right away if you are not doing well or get worse. Document Released: 08/08/2006 Document Revised: 01/23/2012 Document Reviewed: 07/21/2011 ExitCare Patient Information 2015 [employer]. This information is not intended to replace advice given to you by your health care provider. Make sure you discuss any questions you have with your health care provider.    

## 2014-05-20 NOTE — ED Provider Notes (Signed)
Medical screening examination/treatment/procedure(s) were performed by non-physician practitioner and as supervising physician I was immediately available for consultation/collaboration.   EKG Interpretation None        Rolland PorterMark Tyee Vandevoorde, MD 05/20/14 0301

## 2014-08-03 ENCOUNTER — Emergency Department (HOSPITAL_BASED_OUTPATIENT_CLINIC_OR_DEPARTMENT_OTHER): Payer: BC Managed Care – PPO

## 2014-08-03 ENCOUNTER — Emergency Department (HOSPITAL_BASED_OUTPATIENT_CLINIC_OR_DEPARTMENT_OTHER)
Admission: EM | Admit: 2014-08-03 | Discharge: 2014-08-03 | Disposition: A | Discharge status: Home / Self Care | Payer: BC Managed Care – PPO | Attending: Emergency Medicine | Admitting: Emergency Medicine

## 2014-08-03 ENCOUNTER — Encounter (HOSPITAL_BASED_OUTPATIENT_CLINIC_OR_DEPARTMENT_OTHER): Payer: Self-pay | Admitting: Emergency Medicine

## 2014-08-03 DIAGNOSIS — M25569 Pain in unspecified knee: Secondary | ICD-10-CM | POA: Diagnosis not present

## 2014-08-03 DIAGNOSIS — M549 Dorsalgia, unspecified: Secondary | ICD-10-CM | POA: Insufficient documentation

## 2014-08-03 DIAGNOSIS — G43909 Migraine, unspecified, not intractable, without status migrainosus: Secondary | ICD-10-CM | POA: Diagnosis not present

## 2014-08-03 DIAGNOSIS — J45909 Unspecified asthma, uncomplicated: Secondary | ICD-10-CM | POA: Diagnosis not present

## 2014-08-03 DIAGNOSIS — Z79899 Other long term (current) drug therapy: Secondary | ICD-10-CM | POA: Insufficient documentation

## 2014-08-03 DIAGNOSIS — I10 Essential (primary) hypertension: Secondary | ICD-10-CM | POA: Diagnosis not present

## 2014-08-03 DIAGNOSIS — M79609 Pain in unspecified limb: Secondary | ICD-10-CM | POA: Diagnosis not present

## 2014-08-03 DIAGNOSIS — G8911 Acute pain due to trauma: Secondary | ICD-10-CM | POA: Insufficient documentation

## 2014-08-03 DIAGNOSIS — M79605 Pain in left leg: Secondary | ICD-10-CM

## 2014-08-03 MED ORDER — NAPROXEN 500 MG PO TABS: 500.0000 mg | ORAL_TABLET | Freq: Two times a day (BID) | ORAL | Status: DC

## 2014-08-03 NOTE — ED Provider Notes (Signed)
CSN: [id number]     Arrival date & time 08/03/14  0830 History   First MD Initiated Contact with Patient 08/03/14 [phone]     Chief Complaint  Patient presents with  . Leg Pain   HPI Patient presents to the emergency room with complaints of pain in her left lower leg for the last 3 days. Patient states she twisted her leg a few days ago. Since that time she's been having pain in her left knee and left calf. She was seen at [facility] emergency department yesterday and had an ultrasound of her leg that was reportedly negative. The patient states she was diagnosed with a muscle strain. Patient states she does not feel like she has a muscle strain. She feels like there is something placing her joints. She is here requesting x-rays of her knee and lower leg. In trouble with fevers, chills chest pain or shortness of breath. She does have trouble with back pain but does not feel that this is pain originating from her back radiating down her leg Past Medical History  Diagnosis Date  . Hypertension   . Migraine   . Asthma   . DDD (degenerative disc disease)    Past Surgical History  Procedure Laterality Date  . Cholecystectomy    . Abdominal hysterectomy    . Appendectomy     Family History  Problem Relation Age of Onset  . Hypertension Mother   . Diabetes Father   . Hypertension Father   . Heart attack Neg Hx    History  Substance Use Topics  . Smoking status: Never Smoker   . Smokeless tobacco: Never Used  . Alcohol Use: Yes     Comment: occasional   OB History   Grav Para Term Preterm Abortions TAB SAB Ect Mult Living                 Review of Systems  All other systems reviewed and are negative.     Allergies  Review of patient's allergies indicates no known allergies.  Home Medications   Prior to Admission medications   Medication Sig Start Date End Date Taking? Authorizing Provider  albuterol (PROVENTIL HFA;VENTOLIN HFA) 108 (90 BASE) MCG/ACT inhaler Inhale 2 puffs  into the lungs every 4 (four) hours as needed for wheezing. 09/22/11 01/22/13  Glynn Octave, MD  b complex vitamins tablet Take 1 tablet by mouth daily.    Historical Provider, MD  enalapril (VASOTEC) 5 MG tablet Take 5 mg by mouth daily.    Historical Provider, MD  fexofenadine (ALLEGRA) 60 MG tablet Take 1 tablet (60 mg total) by mouth 2 (two) times daily. 11/14/13   [name], MD  gabapentin (NEURONTIN) 300 MG capsule Take 300 mg by mouth at bedtime.    Historical Provider, MD  lisinopril-hydrochlorothiazide (PRINZIDE,ZESTORETIC) 10-12.5 MG per tablet Take 1 tablet by mouth daily.    Historical Provider, MD  meclizine (ANTIVERT) 25 MG tablet Take 1 tablet (25 mg total) by mouth 4 (four) times daily. 05/14/14   Elson Areas, PA-C  methocarbamol (ROBAXIN) 500 MG tablet Take 1 tablet (500 mg total) by mouth 2 (two) times daily. 07/14/13   Elson Areas, PA-C  Multiple Vitamin (MULITIVITAMIN WITH MINERALS) TABS Take 1 tablet by mouth daily.    Historical Provider, MD  naproxen (NAPROSYN) 500 MG tablet Take 1 tablet (500 mg total) by mouth 2 (two) times daily. 08/03/14   Linwood Dibbles, MD  topiramate (TOPAMAX) 100 MG tablet Take 100  mg by mouth 2 (two) times daily.    Historical Provider, MD   BP 111/63  Pulse 81  Temp(Src) 98 F (36.7 C) (Oral)  Resp 18  Ht  (1.575 m)  Wt 159 lb (72.122 kg)  BMI 29.07 kg/m2  SpO2 100% Physical Exam  Nursing note and vitals reviewed. Constitutional: She appears well-developed and well-nourished. No distress.  HENT:  Head: Normocephalic and atraumatic.  Right Ear: External ear normal.  Left Ear: External ear normal.  Eyes: Conjunctivae are normal. Right eye exhibits no discharge. Left eye exhibits no discharge. No scleral icterus.  Neck: Neck supple. No tracheal deviation present.  Cardiovascular: Normal rate.   Pulmonary/Chest: Effort normal. No stridor. No respiratory distress.  Musculoskeletal: She exhibits no edema.       Left knee: She exhibits  no effusion and no ecchymosis. Tenderness found.       Left lower leg: She exhibits tenderness. She exhibits no swelling and no edema.  Neurological: She is alert. Cranial nerve deficit: no gross deficits.  Skin: Skin is warm and dry. No rash noted.  Psychiatric: She has a normal mood and affect.    ED Course  Procedures (including critical care time) Labs Review Labs Reviewed - No data to display  Imaging Review Dg Tibia/fibula Left  08/03/2014   CLINICAL DATA:  Running injury.  Fell.  CT pain.  EXAM: LEFT TIBIA AND FIBULA - 2 VIEW  COMPARISON:  None.  FINDINGS: No abnormality of the tibia or fibula. No apparent soft tissue abnormality.  IMPRESSION: Negative radiographs.   Electronically Signed   By: Paulina Fusi M.D.   On: 08/03/2014 09:20   Dg Knee Complete 4 Views Left  08/03/2014   CLINICAL DATA:  Running injury with twisting of the knee. Lower leg pain and knee pain.  EXAM: LEFT KNEE - COMPLETE 4+ VIEW  COMPARISON:  None.  FINDINGS: No joint effusion. No fracture. No degenerative change. No other focal finding.  IMPRESSION: Normal radiographs   Electronically Signed   By: Paulina Fusi M.D.   On: 08/03/2014 09:19    Crtuches given for symptomatic relief   MDM   Final diagnoses:  Pain of left lower extremity    The patient's x-rays are unremarkable. There is no evidence of bone or joint abnormality. Patient's symptoms could be related to muscle strain. Discussed the possibility of radicular type symptoms as well.  At this time there is no emergency medical condition. He'll have a followup with a sports medicine or orthopedic doctor for further treatment and evaluation    Linwood Dibbles, MD 08/03/14 [phone]

## 2014-08-03 NOTE — ED Notes (Signed)
Pt c/o left lower leg pain x 3 days , seen Friday at [facility] ed US done

## 2014-10-20 ENCOUNTER — Emergency Department (HOSPITAL_BASED_OUTPATIENT_CLINIC_OR_DEPARTMENT_OTHER): Payer: BC Managed Care – PPO

## 2014-10-20 ENCOUNTER — Encounter (HOSPITAL_BASED_OUTPATIENT_CLINIC_OR_DEPARTMENT_OTHER): Payer: Self-pay | Admitting: *Deleted

## 2014-10-20 ENCOUNTER — Emergency Department (HOSPITAL_BASED_OUTPATIENT_CLINIC_OR_DEPARTMENT_OTHER)
Admission: EM | Admit: 2014-10-20 | Discharge: 2014-10-20 | Disposition: A | Discharge status: Home / Self Care | Payer: BC Managed Care – PPO | Attending: Emergency Medicine | Admitting: Emergency Medicine

## 2014-10-20 DIAGNOSIS — Z79899 Other long term (current) drug therapy: Secondary | ICD-10-CM | POA: Insufficient documentation

## 2014-10-20 DIAGNOSIS — I10 Essential (primary) hypertension: Secondary | ICD-10-CM | POA: Insufficient documentation

## 2014-10-20 DIAGNOSIS — R079 Chest pain, unspecified: Secondary | ICD-10-CM | POA: Diagnosis present

## 2014-10-20 DIAGNOSIS — J45901 Unspecified asthma with (acute) exacerbation: Secondary | ICD-10-CM | POA: Diagnosis not present

## 2014-10-20 DIAGNOSIS — Z791 Long term (current) use of non-steroidal anti-inflammatories (NSAID): Secondary | ICD-10-CM | POA: Insufficient documentation

## 2014-10-20 DIAGNOSIS — R0789 Other chest pain: Secondary | ICD-10-CM | POA: Diagnosis not present

## 2014-10-20 DIAGNOSIS — G43909 Migraine, unspecified, not intractable, without status migrainosus: Secondary | ICD-10-CM | POA: Insufficient documentation

## 2014-10-20 LAB — COMPREHENSIVE METABOLIC PANEL
ALK PHOS: 96 U/L (ref 39–117)
ALT: 12 U/L (ref 0–35)
AST: 16 U/L (ref 0–37)
Albumin: 3.8 g/dL (ref 3.5–5.2)
Anion gap: 12 (ref 5–15)
BILIRUBIN TOTAL: 0.2 mg/dL — AB (ref 0.3–1.2)
BUN: 10 mg/dL (ref 6–23)
CHLORIDE: 101 meq/L (ref 96–112)
CO2: 27 meq/L (ref 19–32)
Calcium: 9.3 mg/dL (ref 8.4–10.5)
Creatinine, Ser: 0.7 mg/dL (ref 0.50–1.10)
GLUCOSE: 84 mg/dL (ref 70–99)
POTASSIUM: 4.2 meq/L (ref 3.7–5.3)
SODIUM: 140 meq/L (ref 137–147)
Total Protein: 7.5 g/dL (ref 6.0–8.3)

## 2014-10-20 LAB — CBC WITH DIFFERENTIAL/PLATELET
Basophils Absolute: 0 10*3/uL (ref 0.0–0.1)
Basophils Relative: 1 % (ref 0–1)
EOS ABS: 0 10*3/uL (ref 0.0–0.7)
EOS PCT: 1 % (ref 0–5)
HCT: 38.5 % (ref 36.0–46.0)
Hemoglobin: 12.5 g/dL (ref 12.0–15.0)
Lymphocytes Relative: 40 % (ref 12–46)
Lymphs Abs: 2.1 10*3/uL (ref 0.7–4.0)
MCH: 29.3 pg (ref 26.0–34.0)
MCHC: 32.5 g/dL (ref 30.0–36.0)
MCV: 90.2 fL (ref 78.0–100.0)
Monocytes Absolute: 0.4 10*3/uL (ref 0.1–1.0)
Monocytes Relative: 8 % (ref 3–12)
Neutro Abs: 2.6 10*3/uL (ref 1.7–7.7)
Neutrophils Relative %: 50 % (ref 43–77)
PLATELETS: 241 10*3/uL (ref 150–400)
RBC: 4.27 MIL/uL (ref 3.87–5.11)
RDW: 14 % (ref 11.5–15.5)
WBC: 5.2 10*3/uL (ref 4.0–10.5)

## 2014-10-20 LAB — TROPONIN I: Troponin I: <0.3 ng/mL (ref <0.30)

## 2014-10-20 MED ORDER — KETOROLAC TROMETHAMINE 30 MG/ML IJ SOLN
60.0000 mg | Freq: Once | INTRAMUSCULAR | Status: AC
  Administered 2014-10-20: 60 mg via INTRAMUSCULAR
  Filled 2014-10-20: qty 2

## 2014-10-20 NOTE — ED Notes (Signed)
Pt states she woke up this am at 0100 with left sided chest pain, radiating to left arm.  Pt denies N/V. Some sob.

## 2014-10-20 NOTE — Discharge Instructions (Signed)
Ibuprofen 600 mg 3 times daily for the next 5 days.  Return to the emergency department if you experience worsening pain, difficulty breathing, or other new and concerning symptoms.   Chest Pain (Nonspecific) It is often hard to give a specific diagnosis for the cause of chest pain. There is always a chance that your pain could be related to something serious, such as a heart attack or a blood clot in the lungs. You need to follow up with your health care provider for further evaluation. CAUSES   Heartburn.  Pneumonia or bronchitis.  Anxiety or stress.  Inflammation around your heart (pericarditis) or lung (pleuritis or pleurisy).  A blood clot in the lung.  A collapsed lung (pneumothorax). It can develop suddenly on its own (spontaneous pneumothorax) or from trauma to the chest.  Shingles infection (herpes zoster virus). The chest wall is composed of bones, muscles, and cartilage. Any of these can be the source of the pain.  The bones can be bruised by injury.  The muscles or cartilage can be strained by coughing or overwork.  The cartilage can be affected by inflammation and become sore (costochondritis). DIAGNOSIS  Lab tests or other studies may be needed to find the cause of your pain. Your health care provider may have you take a test called an ambulatory electrocardiogram (ECG). An ECG records your heartbeat patterns over a 24-hour period. You may also have other tests, such as:  Transthoracic echocardiogram (TTE). During echocardiography, sound waves are used to evaluate how blood flows through your heart.  Transesophageal echocardiogram (TEE).  Cardiac monitoring. This allows your health care provider to monitor your heart rate and rhythm in real time.  Holter monitor. This is a portable device that records your heartbeat and can help diagnose heart arrhythmias. It allows your health care provider to track your heart activity for several days, if needed.  Stress tests  by exercise or by giving medicine that makes the heart beat faster. TREATMENT   Treatment depends on what may be causing your chest pain. Treatment may include:  Acid blockers for heartburn.  Anti-inflammatory medicine.  Pain medicine for inflammatory conditions.  Antibiotics if an infection is present.  You may be advised to change lifestyle habits. This includes stopping smoking and avoiding alcohol, caffeine, and chocolate.  You may be advised to keep your head raised (elevated) when sleeping. This reduces the chance of acid going backward from your stomach into your esophagus. Most of the time, nonspecific chest pain will improve within 2-3 days with rest and mild pain medicine.  HOME CARE INSTRUCTIONS   If antibiotics were prescribed, take them as directed. Finish them even if you start to feel better.  For the next few days, avoid physical activities that bring on chest pain. Continue physical activities as directed.  Do not use any tobacco products, including cigarettes, chewing tobacco, or electronic cigarettes.  Avoid drinking alcohol.  Only take medicine as directed by your health care provider.  Follow your health care provider's suggestions for further testing if your chest pain does not go away.  Keep any follow-up appointments you made. If you do not go to an appointment, you could develop lasting (chronic) problems with pain. If there is any problem keeping an appointment, call to reschedule. SEEK MEDICAL CARE IF:   Your chest pain does not go away, even after treatment.  You have a rash with blisters on your chest.  You have a fever. SEEK IMMEDIATE MEDICAL CARE IF:  You have increased chest pain or pain that spreads to your arm, neck, jaw, back, or abdomen.  You have shortness of breath.  You have an increasing cough, or you cough up blood.  You have severe back or abdominal pain.  You feel nauseous or vomit.  You have severe weakness.  You  faint.  You have chills. This is an emergency. Do not wait to see if the pain will go away. Get medical help at once. Call your local emergency services (911 in [place] Do not drive yourself to the hospital. MAKE SURE YOU:   Understand these instructions.  Will watch your condition.  Will get help right away if you are not doing well or get worse. Document Released: 08/10/2005 Document Revised: 11/05/2013 Document Reviewed: 06/05/2008 [facility] Patient Information 2015 [place], [place]. This information is not intended to replace advice given to you by your health care provider. Make sure you discuss any questions you have with your health care provider.

## 2014-10-20 NOTE — ED Provider Notes (Signed)
CSN: [id number]     Arrival date & time 10/20/14  [id number] History   First MD Initiated Contact with Patient 10/20/14 0915     Chief Complaint  Patient presents with  . Chest Pain     (Consider location/radiation/quality/duration/timing/severity/associated sxs/prior Treatment) HPI Comments: Patient is a 48 year old female with history of hypertension and migraines. She presents today with complaints of pressure to the front of her chest that radiates to her left shoulder. She states that this started at 1 AM and woke her from sleep. She states she feels mildly short of breath but denies nausea or diaphoresis. She has no prior cardiac history and denies any recent exertional symptoms. She states that she is active and exercises several times weekly without dyspnea or discomfort.  Patient is a 48 y.o. female presenting with chest pain. The history is provided by the patient.  Chest Pain Pain location:  Substernal area Pain quality: tightness   Pain radiates to:  L shoulder Pain radiates to the back: no   Pain severity:  Moderate Onset quality:  Sudden Duration:  8 hours Timing:  Constant Progression:  Worsening Chronicity:  New Relieved by:  Nothing Worsened by:  Nothing tried Ineffective treatments:  None tried Associated symptoms: shortness of breath   Associated symptoms: no fever and no nausea     Past Medical History  Diagnosis Date  . Hypertension   . Migraine   . Asthma   . DDD (degenerative disc disease)    Past Surgical History  Procedure Laterality Date  . Cholecystectomy    . Abdominal hysterectomy    . Appendectomy     Family History  Problem Relation Age of Onset  . Hypertension Mother   . Diabetes Father   . Hypertension Father   . Heart attack Neg Hx    History  Substance Use Topics  . Smoking status: Never Smoker   . Smokeless tobacco: Never Used  . Alcohol Use: Yes     Comment: occasional   OB History    No data available     Review of Systems   Constitutional: Negative for fever.  Respiratory: Positive for shortness of breath.   Cardiovascular: Positive for chest pain.  Gastrointestinal: Negative for nausea.  All other systems reviewed and are negative.     Allergies  Review of patient's allergies indicates no known allergies.  Home Medications   Prior to Admission medications   Medication Sig Start Date End Date Taking? Authorizing Provider  albuterol (PROVENTIL HFA;VENTOLIN HFA) 108 (90 BASE) MCG/ACT inhaler Inhale 2 puffs into the lungs every 4 (four) hours as needed for wheezing. 09/22/11 01/22/13  Glynn OctaveStephen Rancour, MD  b complex vitamins tablet Take 1 tablet by mouth daily.    Historical Provider, MD  enalapril (VASOTEC) 5 MG tablet Take 5 mg by mouth daily.    Historical Provider, MD  fexofenadine (ALLEGRA) 60 MG tablet Take 1 tablet (60 mg total) by mouth 2 (two) times daily. 11/14/13   Rolan BuccoMelanie Belfi, MD  gabapentin (NEURONTIN) 300 MG capsule Take 300 mg by mouth at bedtime.    Historical Provider, MD  lisinopril-hydrochlorothiazide (PRINZIDE,ZESTORETIC) 10-12.5 MG per tablet Take 1 tablet by mouth daily.    Historical Provider, MD  meclizine (ANTIVERT) 25 MG tablet Take 1 tablet (25 mg total) by mouth 4 (four) times daily. 05/14/14   Elson AreasLeslie K Sofia, PA-C  methocarbamol (ROBAXIN) 500 MG tablet Take 1 tablet (500 mg total) by mouth 2 (two) times daily. 07/14/13   Verlon AuLeslie  K Sofia, PA-C  Multiple Vitamin (MULITIVITAMIN WITH MINERALS) TABS Take 1 tablet by mouth daily.    Historical Provider, MD  naproxen (NAPROSYN) 500 MG tablet Take 1 tablet (500 mg total) by mouth 2 (two) times daily. 08/03/14   Linwood DibblesJon Knapp, MD  topiramate (TOPAMAX) 100 MG tablet Take 100 mg by mouth 2 (two) times daily.    Historical Provider, MD   BP 132/64 mmHg  Pulse 76  Temp(Src) 99.2 F (37.3 C) (Oral)  Resp 18  SpO2 100% Physical Exam  Constitutional: She is oriented to person, place, and time. She appears well-developed and well-nourished. No  distress.  HENT:  Head: Normocephalic and atraumatic.  Neck: Normal range of motion. Neck supple.  Cardiovascular: Normal rate and regular rhythm.  Exam reveals no gallop and no friction rub.   No murmur heard. Pulmonary/Chest: Effort normal and breath sounds normal. No respiratory distress. She has no wheezes. She exhibits tenderness.  There is tenderness to palpation to the upper sternum and left upper anterior ribs. This reproduces her symptoms.  Abdominal: Soft. Bowel sounds are normal. She exhibits no distension. There is no tenderness.  Musculoskeletal: Normal range of motion.  Neurological: She is alert and oriented to person, place, and time.  Skin: Skin is warm and dry. She is not diaphoretic.  Nursing note and vitals reviewed.   ED Course  Procedures (including critical care time) Labs Review Labs Reviewed  COMPREHENSIVE METABOLIC PANEL  TROPONIN I  CBC WITH DIFFERENTIAL    Imaging Review No results found.   EKG Interpretation   Date/Time:  Monday October 20 2014 09:02:51 EST Ventricular Rate:  75 PR Interval:  190 QRS Duration: 74 QT Interval:  382 QTC Calculation: 426 R Axis:   72 Text Interpretation:  Normal sinus rhythm with sinus arrhythmia Normal ECG  Confirmed by DELOS  MD, Mandy Peeks ([id number]) on 10/20/2014 9:16:42 AM      MDM   Final diagnoses:  None    Patient presents with more than 8 hours of atypical chest discomfort. Her symptoms are reproducible with palpation of her chest wall. Her EKG and troponin are both normal and I have no reason to suspect PE. She is feeling better with Toradol and I highly suspect a musculoskeletal etiology. I will recommend anti-inflammatories, rest, and when necessary follow-up.    Geoffery Lyonsouglas Merve Hotard, MD 10/20/14 [phone]

## 2015-09-24 ENCOUNTER — Emergency Department (HOSPITAL_BASED_OUTPATIENT_CLINIC_OR_DEPARTMENT_OTHER)
Admission: EM | Admit: 2015-09-24 | Discharge: 2015-09-24 | Disposition: A | Discharge status: Home / Self Care | Payer: Self-pay | Attending: Emergency Medicine | Admitting: Emergency Medicine

## 2015-09-24 ENCOUNTER — Encounter (HOSPITAL_BASED_OUTPATIENT_CLINIC_OR_DEPARTMENT_OTHER): Payer: Self-pay

## 2015-09-24 DIAGNOSIS — J45909 Unspecified asthma, uncomplicated: Secondary | ICD-10-CM | POA: Insufficient documentation

## 2015-09-24 DIAGNOSIS — Z8739 Personal history of other diseases of the musculoskeletal system and connective tissue: Secondary | ICD-10-CM | POA: Insufficient documentation

## 2015-09-24 DIAGNOSIS — I10 Essential (primary) hypertension: Secondary | ICD-10-CM | POA: Insufficient documentation

## 2015-09-24 DIAGNOSIS — Z79899 Other long term (current) drug therapy: Secondary | ICD-10-CM | POA: Insufficient documentation

## 2015-09-24 DIAGNOSIS — G43809 Other migraine, not intractable, without status migrainosus: Secondary | ICD-10-CM | POA: Insufficient documentation

## 2015-09-24 MED ORDER — PROMETHAZINE HCL 25 MG/ML IJ SOLN
25.0000 mg | Freq: Once | INTRAMUSCULAR | Status: AC
  Administered 2015-09-24: 25 mg via INTRAVENOUS
  Filled 2015-09-24: qty 1

## 2015-09-24 MED ORDER — KETOROLAC TROMETHAMINE 30 MG/ML IJ SOLN
30.0000 mg | Freq: Once | INTRAMUSCULAR | Status: AC
  Administered 2015-09-24: 30 mg via INTRAVENOUS
  Filled 2015-09-24: qty 1

## 2015-09-24 MED ORDER — SODIUM CHLORIDE 0.9 % IV BOLUS (SEPSIS)
500.0000 mL | Freq: Once | INTRAVENOUS | Status: AC
  Administered 2015-09-24: 500 mL via INTRAVENOUS

## 2015-09-24 NOTE — Discharge Instructions (Signed)

## 2015-09-24 NOTE — ED Provider Notes (Signed)
CSN: [id number]     Arrival date & time 09/24/15  1506 History   First MD Initiated Contact with Patient 09/24/15 1536     Chief Complaint  Patient presents with  . Migraine     (Consider location/radiation/quality/duration/timing/severity/associated sxs/prior Treatment) Patient is a 49 y.o. female presenting with migraines. The history is provided by the patient.  Migraine This is a recurrent problem. Associated symptoms include headaches. Pertinent negatives include no chest pain, no abdominal pain and no shortness of breath.   patient has a history of migraines. For the last 4 days she's had one previous throbbing in the front of her head. Had some photophobia and sonophobia. No relief with Topamax. She has not had a severe headache like this in the last year. She is on Topamax for prevention. Had nausea with some vomiting. No fevers or chills. Denies hospital pregnancy. She called  Primary Dr.'s told to come in to the ER. No trauma.  Past Medical History  Diagnosis Date  . Hypertension   . Migraine   . Asthma   . DDD (degenerative disc disease)    Past Surgical History  Procedure Laterality Date  . Cholecystectomy    . Abdominal hysterectomy    . Appendectomy     Family History  Problem Relation Age of Onset  . Hypertension Mother   . Diabetes Father   . Hypertension Father   . Heart attack Neg Hx    Social History  Substance Use Topics  . Smoking status: Never Smoker   . Smokeless tobacco: Never Used  . Alcohol Use: Yes     Comment: occasional   OB History    No data available     Review of Systems  Constitutional: Negative for activity change.  HENT: Negative for facial swelling, nosebleeds and sneezing.   Eyes: Positive for photophobia. Negative for pain.  Respiratory: Negative for chest tightness and shortness of breath.   Cardiovascular: Negative for chest pain.  Gastrointestinal: Positive for nausea and vomiting. Negative for abdominal pain and diarrhea.   Endocrine: Negative for polydipsia.  Genitourinary: Negative for flank pain.  Musculoskeletal: Negative for back pain, neck pain and neck stiffness.  Skin: Negative for rash.  Neurological: Positive for headaches. Negative for weakness and numbness.      Allergies  Review of patient's allergies indicates no known allergies.  Home Medications   Prior to Admission medications   Medication Sig Start Date End Date Taking? Authorizing Provider  albuterol (PROVENTIL HFA;VENTOLIN HFA) 108 (90 BASE) MCG/ACT inhaler Inhale 2 puffs into the lungs every 4 (four) hours as needed for wheezing. 09/22/11 09/24/15 Yes Glynn OctaveStephen Rancour, MD  b complex vitamins tablet Take 1 tablet by mouth daily.   Yes Historical Provider, MD  enalapril (VASOTEC) 5 MG tablet Take 5 mg by mouth daily.   Yes Historical Provider, MD  fexofenadine (ALLEGRA) 60 MG tablet Take 1 tablet (60 mg total) by mouth 2 (two) times daily. 11/14/13  Yes Rolan BuccoMelanie Belfi, MD  gabapentin (NEURONTIN) 300 MG capsule Take 300 mg by mouth at bedtime.   Yes Historical Provider, MD  lisinopril-hydrochlorothiazide (PRINZIDE,ZESTORETIC) 10-12.5 MG per tablet Take 1 tablet by mouth daily.   Yes Historical Provider, MD  meclizine (ANTIVERT) 25 MG tablet Take 1 tablet (25 mg total) by mouth 4 (four) times daily. 05/14/14  Yes Elson AreasLeslie K Sofia, PA-C  Multiple Vitamin (MULITIVITAMIN WITH MINERALS) TABS Take 1 tablet by mouth daily.   Yes Historical Provider, MD  naproxen (NAPROSYN) 500 MG tablet  Take 1 tablet (500 mg total) by mouth 2 (two) times daily. 08/03/14  Yes Linwood Dibbles, MD  topiramate (TOPAMAX) 100 MG tablet Take 100 mg by mouth 2 (two) times daily.   Yes Historical Provider, MD  methocarbamol (ROBAXIN) 500 MG tablet Take 1 tablet (500 mg total) by mouth 2 (two) times daily. 07/14/13   Elson Areas, PA-C   BP 119/77 mmHg  Pulse 78  Temp(Src) 98.4 F (36.9 C) (Oral)  Resp 16  Ht  (1.575 m)  Wt 178 lb (80.74 kg)  BMI 32.55 kg/m2  SpO2  99% Physical Exam  Constitutional: She appears well-developed.  Eyes: Pupils are equal, round, and reactive to light.  Neck: Neck supple.  Cardiovascular: Normal rate.   Pulmonary/Chest: Effort normal.  Abdominal: Soft.  Musculoskeletal: Normal range of motion.  Skin: Skin is warm.    ED Course  Procedures (including critical care time) Labs Review Labs Reviewed - No data to display  Imaging Review No results found. I have personally reviewed and evaluated these images and lab results as part of my medical decision-making.   EKG Interpretation None      MDM   Final diagnoses:  Other migraine without status migrainosus, not intractable    Patient with headache. Feels better after treatment. Will discharge home.    Benjiman Core, MD 09/24/15 2337

## 2015-09-24 NOTE — ED Notes (Signed)
Patient preparing for discharge. 

## 2015-09-24 NOTE — ED Notes (Signed)
Reports migraine that started 4 days ago.  Reports called pcp but told her to come here.  reports headache is same as all migraines

## 2017-01-10 ENCOUNTER — Emergency Department (HOSPITAL_BASED_OUTPATIENT_CLINIC_OR_DEPARTMENT_OTHER)
Admission: EM | Admit: 2017-01-10 | Discharge: 2017-01-10 | Disposition: A | Discharge status: Home / Self Care | Payer: Self-pay | Attending: Emergency Medicine | Admitting: Emergency Medicine

## 2017-01-10 ENCOUNTER — Emergency Department (HOSPITAL_BASED_OUTPATIENT_CLINIC_OR_DEPARTMENT_OTHER): Payer: Self-pay

## 2017-01-10 ENCOUNTER — Encounter (HOSPITAL_BASED_OUTPATIENT_CLINIC_OR_DEPARTMENT_OTHER): Payer: Self-pay | Admitting: *Deleted

## 2017-01-10 DIAGNOSIS — R110 Nausea: Secondary | ICD-10-CM | POA: Insufficient documentation

## 2017-01-10 DIAGNOSIS — J45909 Unspecified asthma, uncomplicated: Secondary | ICD-10-CM | POA: Insufficient documentation

## 2017-01-10 DIAGNOSIS — M546 Pain in thoracic spine: Secondary | ICD-10-CM | POA: Insufficient documentation

## 2017-01-10 DIAGNOSIS — Z79899 Other long term (current) drug therapy: Secondary | ICD-10-CM | POA: Insufficient documentation

## 2017-01-10 DIAGNOSIS — R079 Chest pain, unspecified: Secondary | ICD-10-CM | POA: Insufficient documentation

## 2017-01-10 DIAGNOSIS — R05 Cough: Secondary | ICD-10-CM | POA: Insufficient documentation

## 2017-01-10 DIAGNOSIS — R0602 Shortness of breath: Secondary | ICD-10-CM | POA: Insufficient documentation

## 2017-01-10 DIAGNOSIS — I10 Essential (primary) hypertension: Secondary | ICD-10-CM | POA: Insufficient documentation

## 2017-01-10 LAB — BASIC METABOLIC PANEL
ANION GAP: 7 (ref 5–15)
BUN: 5 mg/dL — ABNORMAL LOW (ref 6–20)
CO2: 29 mmol/L (ref 22–32)
CREATININE: 0.82 mg/dL (ref 0.44–1.00)
Calcium: 8.9 mg/dL (ref 8.9–10.3)
Chloride: 105 mmol/L (ref 101–111)
Glucose, Bld: 91 mg/dL (ref 65–99)
Potassium: 3.4 mmol/L — ABNORMAL LOW (ref 3.5–5.1)
SODIUM: 141 mmol/L (ref 135–145)

## 2017-01-10 LAB — CBC WITH DIFFERENTIAL/PLATELET
BASOS ABS: 0 10*3/uL (ref 0.0–0.1)
BASOS PCT: 0 %
EOS ABS: 0.1 10*3/uL (ref 0.0–0.7)
Eosinophils Relative: 2 %
HEMATOCRIT: 40.1 % (ref 36.0–46.0)
Hemoglobin: 13.4 g/dL (ref 12.0–15.0)
Lymphocytes Relative: 46 %
Lymphs Abs: 2.2 10*3/uL (ref 0.7–4.0)
MCH: 29.6 pg (ref 26.0–34.0)
MCHC: 33.4 g/dL (ref 30.0–36.0)
MCV: 88.5 fL (ref 78.0–100.0)
Monocytes Absolute: 0.3 10*3/uL (ref 0.1–1.0)
Monocytes Relative: 6 %
NEUTROS ABS: 2.2 10*3/uL (ref 1.7–7.7)
NEUTROS PCT: 46 %
Platelets: 251 10*3/uL (ref 150–400)
RBC: 4.53 MIL/uL (ref 3.87–5.11)
RDW: 13 % (ref 11.5–15.5)
WBC: 4.7 10*3/uL (ref 4.0–10.5)

## 2017-01-10 LAB — TROPONIN I

## 2017-01-10 MED ORDER — IBUPROFEN 400 MG PO TABS
600.0000 mg | ORAL_TABLET | Freq: Once | ORAL | Status: AC
  Administered 2017-01-10: 600 mg via ORAL
  Filled 2017-01-10: qty 1

## 2017-01-10 NOTE — ED Provider Notes (Addendum)
[facility]   CSN: [id number] Arrival date & time: 01/10/17  4098     History   Chief Complaint Chief Complaint  Patient presents with  . Chest Pain    HPI Veronica Braun is a 51 y.o. female.  HPI  51 year old female presents with chest pain that woke her up at 3 AM. It is coming and going. Last a few seconds at a time. Feels a tightness in her mid chest and goes to her back. In between there is a low level pain. When the pain comes severely it takes her breath away. In between she has no shortness of breath. Some nausea. No vomiting or diaphoresis. No radiation of the pain down her arms or neck. No exertional component. No leg swelling or leg pain. She has had a cough for the past one week that she thinks is related to sinus drainage. No pleuritic component. Has history of HTN. No smoking, hx of HLD, DM or family hx of early CAD  Past Medical History:  Diagnosis Date  . Asthma   . DDD (degenerative disc disease)   . Hypertension   . Migraine     Patient Active Problem List   Diagnosis Date Noted  . Neck pain 04/02/2013  . MIGRAINE HEADACHE 12/29/2010  . ESSENTIAL HYPERTENSION, BENIGN 12/29/2010  . ELBOW PAIN, RIGHT 12/29/2010  . BACK PAIN 12/29/2010    Past Surgical History:  Procedure Laterality Date  . ABDOMINAL HYSTERECTOMY    . APPENDECTOMY    . CHOLECYSTECTOMY      OB History    No data available       Home Medications    Prior to Admission medications   Medication Sig Start Date End Date Taking? Authorizing Provider  albuterol (PROVENTIL HFA;VENTOLIN HFA) 108 (90 BASE) MCG/ACT inhaler Inhale 2 puffs into the lungs every 4 (four) hours as needed for wheezing. 09/22/11 01/10/17 Yes Glynn Octave, MD  b complex vitamins tablet Take 1 tablet by mouth daily.   Yes Historical Provider, MD  lisinopril-hydrochlorothiazide (PRINZIDE,ZESTORETIC) 10-12.5 MG per tablet Take 1 tablet by mouth daily.   Yes Historical Provider, MD  meclizine  (ANTIVERT) 25 MG tablet Take 1 tablet (25 mg total) by mouth 4 (four) times daily. 05/14/14  Yes Elson Areas, PA-C  Multiple Vitamin (MULITIVITAMIN WITH MINERALS) TABS Take 1 tablet by mouth daily.   Yes Historical Provider, MD  enalapril (VASOTEC) 5 MG tablet Take 5 mg by mouth daily.    Historical Provider, MD  fexofenadine (ALLEGRA) 60 MG tablet Take 1 tablet (60 mg total) by mouth 2 (two) times daily. 11/14/13   [name], MD  gabapentin (NEURONTIN) 300 MG capsule Take 300 mg by mouth at bedtime.    Historical Provider, MD  methocarbamol (ROBAXIN) 500 MG tablet Take 1 tablet (500 mg total) by mouth 2 (two) times daily. 07/14/13   Elson Areas, PA-C  naproxen (NAPROSYN) 500 MG tablet Take 1 tablet (500 mg total) by mouth 2 (two) times daily. 08/03/14   Linwood Dibbles, MD  topiramate (TOPAMAX) 100 MG tablet Take 100 mg by mouth 2 (two) times daily.    Historical Provider, MD    Family History Family History  Problem Relation Age of Onset  . Hypertension Mother   . Diabetes Father   . Hypertension Father   . Heart attack Neg Hx     Social History Social History  Substance Use Topics  . Smoking status: Never Smoker  . Smokeless tobacco:  Never Used  . Alcohol use Yes     Comment: occasional     Allergies   Patient has no known allergies.   Review of Systems Review of Systems  Constitutional: Negative for fever.  Respiratory: Positive for cough and shortness of breath.   Cardiovascular: Positive for chest pain.  Gastrointestinal: Positive for nausea. Negative for abdominal pain and vomiting.  Musculoskeletal: Positive for back pain.  All other systems reviewed and are negative.    Physical Exam Updated Vital Signs BP 112/60   Pulse 67   Temp 98.1 F (36.7 C) (Oral)   Resp 15   Ht 5\' 2"  (1.575 m)   Wt 163 lb (73.9 kg)   SpO2 100%   BMI 29.81 kg/m   Physical Exam  Constitutional: She is oriented to person, place, and time. She appears well-developed and  well-nourished. No distress.  HENT:  Head: Normocephalic and atraumatic.  Right Ear: External ear normal.  Left Ear: External ear normal.  Nose: Nose normal.  Eyes: Right eye exhibits no discharge. Left eye exhibits no discharge.  Cardiovascular: Normal rate, regular rhythm and normal heart sounds.   Pulses:      Radial pulses are 2+ on the right side, and 2+ on the left side.  Pulmonary/Chest: Effort normal and breath sounds normal. She exhibits tenderness (mild).    Abdominal: Soft. There is no tenderness.  Musculoskeletal:       Thoracic back: She exhibits tenderness.       Back:  Neurological: She is alert and oriented to person, place, and time.  Skin: Skin is warm and dry. She is not diaphoretic.  Nursing note and vitals reviewed.    ED Treatments / Results  Labs (all labs ordered are listed, but only abnormal results are displayed) Labs Reviewed  BASIC METABOLIC PANEL - Abnormal; Notable for the following:       Result Value   Potassium 3.4 (*)    BUN 5 (*)    All other components within normal limits  CBC WITH DIFFERENTIAL/PLATELET  TROPONIN I  TROPONIN I    EKG  EKG Interpretation  Date/Time:  Tuesday January 10 2017 09:04:17 EST Ventricular Rate:  75 PR Interval:    QRS Duration: 98 QT Interval:  403 QTC Calculation: 451 R Axis:   59 Text Interpretation:  Sinus rhythm Consider left atrial enlargement no significant change since Dec 2015 Confirmed by Cortny Bambach MD, Vonceil [place] [phone]) on 01/10/2017 9:12:21 AM       EKG Interpretation  Date/Time:  Tuesday January 10 2017 11:57:46 EST Ventricular Rate:  74 PR Interval:    QRS Duration: 99 QT Interval:  411 QTC Calculation: 456 R Axis:   65 Text Interpretation:  Sinus rhythm no acute ST/T changes no significant change since earlier in the day Confirmed by Aysen Shieh MD, Karmon Andis [phone]([id number]) on 01/10/2017 1:25:14 PM       Radiology Dg Chest 2 View  Result Date: 01/10/2017 CLINICAL DATA:  Chest pain.   Hypertension. EXAM: CHEST  2 VIEW COMPARISON:  October 20, 2014 FINDINGS: Lungs are clear. Heart size and pulmonary vascularity are normal. No adenopathy. No pneumothorax. No bone lesions. IMPRESSION: No edema or consolidation. Electronically Signed   By: Bretta BangWilliam  Woodruff III M.D.   On: 01/10/2017 09:54    Procedures Procedures (including critical care time)  Medications Ordered in ED Medications  ibuprofen (ADVIL,MOTRIN) tablet 600 mg (600 mg Oral Given 01/10/17 1031)     Initial Impression / Assessment and Plan / ED  Course  I have reviewed the triage vital signs and the nursing notes.  Pertinent labs & imaging results that were available during my care of the patient were reviewed by me and considered in my medical decision making (see chart for details).  Clinical Course as of Jan 10 1547  Tue Jan 10, 2017  0922 Atypical CP, likely chest wall. ECG unchanged from prior. Doubt PE/dissection. Labs, cxr.   [SG]  1118 Initial workup unremarkable. Will do 2nd troponin in otherwise low risk ACS workup for atypical CP  [SG]    Clinical Course User Index [SG] Pricilla Loveless, MD   Patient likely has chest wall pain. Low risk for ACS. NSAIDs, f/u with PCP. Discussed return precautions.  Final Clinical Impressions(s) / ED Diagnoses   Final diagnoses:  Nonspecific chest pain    New Prescriptions Discharge Medication List as of 01/10/2017  1:25 PM       Pricilla Loveless, MD 01/10/17 1549    Pricilla Loveless, MD 01/10/17 1551

## 2017-01-10 NOTE — ED Notes (Signed)
ED Provider at bedside. 

## 2017-01-10 NOTE — ED Triage Notes (Signed)
Pt reports chest pain woke her from sleep 0300. Pain is sharp and goes through to back. Last ate around 3pm yesterday afternoon

## 2017-01-10 NOTE — ED Notes (Signed)
EMT at bedside for repeat EKG 

## 2017-01-25 ENCOUNTER — Emergency Department (HOSPITAL_BASED_OUTPATIENT_CLINIC_OR_DEPARTMENT_OTHER)
Admission: EM | Admit: 2017-01-25 | Discharge: 2017-01-25 | Disposition: A | Discharge status: Home / Self Care | Payer: Self-pay | Attending: Emergency Medicine | Admitting: Emergency Medicine

## 2017-01-25 ENCOUNTER — Encounter (HOSPITAL_BASED_OUTPATIENT_CLINIC_OR_DEPARTMENT_OTHER): Payer: Self-pay | Admitting: *Deleted

## 2017-01-25 DIAGNOSIS — G43009 Migraine without aura, not intractable, without status migrainosus: Secondary | ICD-10-CM | POA: Insufficient documentation

## 2017-01-25 DIAGNOSIS — J45909 Unspecified asthma, uncomplicated: Secondary | ICD-10-CM | POA: Insufficient documentation

## 2017-01-25 DIAGNOSIS — I10 Essential (primary) hypertension: Secondary | ICD-10-CM | POA: Insufficient documentation

## 2017-01-25 MED ORDER — ONDANSETRON HCL 4 MG/2ML IJ SOLN: 4.0000 mg | Freq: Once | INTRAMUSCULAR | Status: DC

## 2017-01-25 MED ORDER — DIPHENHYDRAMINE HCL 50 MG/ML IJ SOLN
25.0000 mg | Freq: Once | INTRAMUSCULAR | Status: AC
  Administered 2017-01-25: 25 mg via INTRAVENOUS
  Filled 2017-01-25: qty 1

## 2017-01-25 MED ORDER — METOCLOPRAMIDE HCL 5 MG/ML IJ SOLN
10.0000 mg | Freq: Once | INTRAMUSCULAR | Status: AC
  Administered 2017-01-25: 10 mg via INTRAVENOUS
  Filled 2017-01-25: qty 2

## 2017-01-25 MED ORDER — KETOROLAC TROMETHAMINE 30 MG/ML IJ SOLN
30.0000 mg | Freq: Once | INTRAMUSCULAR | Status: AC
  Administered 2017-01-25: 30 mg via INTRAVENOUS
  Filled 2017-01-25: qty 1

## 2017-01-25 NOTE — ED Triage Notes (Signed)
Pt with migraine that started last PM accompanied by N/V

## 2017-01-25 NOTE — ED Notes (Signed)
Attempted IV x 2 with no sucess

## 2017-01-25 NOTE — ED Provider Notes (Signed)
[facility] Provider Note   CSN: [id number] Arrival date & time: 01/25/17  7829     History   Chief Complaint Chief Complaint  Patient presents with  . Migraine    HPI Veronica Braun is a 51 y.o. female with a h/o of HTN and migraines who presents to the Emergency Department with complains of a headache with 10/10 pain that began last night with associated nausea, vomiting x3, photophobia, eye pain, and excessive tearing.  She reports the headache came on gradually, extends bilaterally from her forehead to her occiput, and has worsened throughout the night. No fever, chills, aura, congestion, rash, CP, back or neck pain, dyspnea, dizziness, lightheadedness, or paresthesias. She states she took her abortive medication, Qudexy (topiramate), because the pain felt consistent with her usual migraines with no relief. No other treatments prior to arrival.   PMH includes HTN and migraines. Home medications includes lisinopril and topiramate. She is followed by PA Rose at [facility] for her migraines. She is a non-smoker.   HPI  Past Medical History:  Diagnosis Date  . Asthma   . DDD (degenerative disc disease)   . Hypertension   . Migraine     Patient Active Problem List   Diagnosis Date Noted  . Neck pain 04/02/2013  . MIGRAINE HEADACHE 12/29/2010  . ESSENTIAL HYPERTENSION, BENIGN 12/29/2010  . ELBOW PAIN, RIGHT 12/29/2010  . BACK PAIN 12/29/2010    Past Surgical History:  Procedure Laterality Date  . ABDOMINAL HYSTERECTOMY    . APPENDECTOMY    . CHOLECYSTECTOMY      OB History    No data available       Home Medications    Prior to Admission medications   Medication Sig Start Date End Date Taking? Authorizing Provider  albuterol (PROVENTIL HFA;VENTOLIN HFA) 108 (90 BASE) MCG/ACT inhaler Inhale 2 puffs into the lungs every 4 (four) hours as needed for wheezing. 09/22/11 01/10/17  Glynn Octave, MD  b complex vitamins tablet Take 1 tablet by mouth daily.     Historical Provider, MD  enalapril (VASOTEC) 5 MG tablet Take 5 mg by mouth daily.    Historical Provider, MD  fexofenadine (ALLEGRA) 60 MG tablet Take 1 tablet (60 mg total) by mouth 2 (two) times daily. 11/14/13   [name], MD  gabapentin (NEURONTIN) 300 MG capsule Take 300 mg by mouth at bedtime.    Historical Provider, MD  lisinopril-hydrochlorothiazide (PRINZIDE,ZESTORETIC) 10-12.5 MG per tablet Take 1 tablet by mouth daily.    Historical Provider, MD  meclizine (ANTIVERT) 25 MG tablet Take 1 tablet (25 mg total) by mouth 4 (four) times daily. 05/14/14   Elson Areas, PA-C  methocarbamol (ROBAXIN) 500 MG tablet Take 1 tablet (500 mg total) by mouth 2 (two) times daily. 07/14/13   Elson Areas, PA-C  Multiple Vitamin (MULITIVITAMIN WITH MINERALS) TABS Take 1 tablet by mouth daily.    Historical Provider, MD  naproxen (NAPROSYN) 500 MG tablet Take 1 tablet (500 mg total) by mouth 2 (two) times daily. 08/03/14   Linwood Dibbles, MD  topiramate (TOPAMAX) 100 MG tablet Take 100 mg by mouth 2 (two) times daily.    Historical Provider, MD    Family History Family History  Problem Relation Age of Onset  . Hypertension Mother   . Diabetes Father   . Hypertension Father   . Heart attack Neg Hx     Social History Social History  Substance Use Topics  . Smoking status: Never Smoker  .  Smokeless tobacco: Never Used  . Alcohol use Yes     Comment: occasional     Allergies   Patient has no known allergies.   Review of Systems Review of Systems  Constitutional: Negative for chills and fever.  HENT: Negative for congestion.   Eyes: Positive for photophobia and pain. Negative for itching and visual disturbance.  Respiratory: Negative for shortness of breath.   Cardiovascular: Negative for chest pain.  Gastrointestinal: Positive for nausea and vomiting. Negative for abdominal pain and diarrhea.  Musculoskeletal: Negative for back pain and neck pain.  Skin: Negative for rash.    Neurological: Positive for headaches. Negative for dizziness, syncope, light-headedness and numbness.  Psychiatric/Behavioral: Negative for confusion.    Physical Exam Updated Vital Signs BP 145/91 (BP Location: Right Arm)   Pulse 71   Temp 98.2 F (36.8 C) (Oral)   Resp 16   Ht 5\' 2"  (1.575 m)   Wt 73.9 kg   SpO2 99%   BMI 29.81 kg/m   Physical Exam  Constitutional: She is oriented to person, place, and time. She appears well-developed and well-nourished. No distress.  HENT:  Head: Normocephalic and atraumatic.  Eyes: Conjunctivae and EOM are normal. Pupils are equal, round, and reactive to light. Right eye exhibits discharge (tearing). Left eye exhibits discharge (tearing).  Neck: Normal range of motion. Neck supple.  Cardiovascular: Normal rate, regular rhythm and normal heart sounds.  Exam reveals no gallop and no friction rub.   No murmur heard. Pulmonary/Chest: Effort normal and breath sounds normal. No respiratory distress. She has no wheezes. She has no rales.  Abdominal: Soft. She exhibits no distension.  Musculoskeletal: Normal range of motion. She exhibits no edema or tenderness.  Neurological: She is alert and oriented to person, place, and time. She has normal strength. She displays no tremor. No cranial nerve deficit or sensory deficit. She exhibits normal muscle tone. She displays no seizure activity. GCS eye subscore is 4. GCS verbal subscore is 5. GCS motor subscore is 6.  Skin: Skin is warm and dry. No rash noted. She is not diaphoretic. No erythema.  Psychiatric: She has a normal mood and affect. Her behavior is normal. Judgment and thought content normal.  Nursing note and vitals reviewed.    ED Treatments / Results  Labs (all labs ordered are listed, but only abnormal results are displayed) Labs Reviewed - No data to display  EKG  EKG Interpretation None       Radiology No results found.  Procedures Procedures (including critical care  time)  Medications Ordered in ED Medications  metoCLOPramide (REGLAN) injection 10 mg (not administered)  diphenhydrAMINE (BENADRYL) injection 25 mg (not administered)  ketorolac (TORADOL) 30 MG/ML injection 30 mg (not administered)   Initial Impression / Assessment and Plan / ED Course  I have reviewed the triage vital signs and the nursing notes.  Pertinent labs & imaging results that were available during my care of the patient were reviewed by me and considered in my medical decision making (see chart for details). - 7:20 Evaluated patient. Orders placed for benadryl, Reglan, and Toradol IV. - 7:36 Dr. Criss Alvine, attending, evaluated patient.  - 8:35 Patient recheck. She reports the pain has improved to 5/10.  - 9:00 Patient recheck. She reports the pain has improved to 4/10, and she feels that she is ready for discharge.     Final Clinical Impressions(s) / ED Diagnoses   Final diagnoses:  None   Deaundra Dupriest is a 51 y.o.  female with a h/o of HTN and migraines who presents to ED with a headache that has worsened over the last day. No focal or global neurological deficits on physical exam. No recent trauma. Patient is post-menopausal. Low suspicion for SAH, pseudotumor cerebri, meningitis, or mass. Patient reports the symptoms care consistent with her usual migraines. Treated with diphenhydramine, Reglan, and Toradol in the ED. The patient feels better after treatment.  Will discharge home. Discussed reasons for return to the ED including fever and worsening headache. She has abortive medication at home and can follow up with neurology as needed.    New Prescriptions New Prescriptions   No medications on file     Torria Fromer Conan Bowensdair Terina Mcelhinny, PA-C 01/25/17 [id number]    Pricilla LovelessScott Goldston, MD 01/25/17 [phone]

## 2017-08-15 ENCOUNTER — Emergency Department (HOSPITAL_BASED_OUTPATIENT_CLINIC_OR_DEPARTMENT_OTHER)
Admission: EM | Admit: 2017-08-15 | Discharge: 2017-08-15 | Disposition: A | Discharge status: Home / Self Care | Payer: Self-pay | Attending: Emergency Medicine | Admitting: Emergency Medicine

## 2017-08-15 ENCOUNTER — Encounter (HOSPITAL_BASED_OUTPATIENT_CLINIC_OR_DEPARTMENT_OTHER): Payer: Self-pay | Admitting: Emergency Medicine

## 2017-08-15 DIAGNOSIS — H53149 Visual discomfort, unspecified: Secondary | ICD-10-CM | POA: Insufficient documentation

## 2017-08-15 DIAGNOSIS — Z79899 Other long term (current) drug therapy: Secondary | ICD-10-CM | POA: Insufficient documentation

## 2017-08-15 DIAGNOSIS — I10 Essential (primary) hypertension: Secondary | ICD-10-CM | POA: Insufficient documentation

## 2017-08-15 DIAGNOSIS — R110 Nausea: Secondary | ICD-10-CM | POA: Insufficient documentation

## 2017-08-15 DIAGNOSIS — I159 Secondary hypertension, unspecified: Secondary | ICD-10-CM | POA: Insufficient documentation

## 2017-08-15 DIAGNOSIS — J45909 Unspecified asthma, uncomplicated: Secondary | ICD-10-CM | POA: Insufficient documentation

## 2017-08-15 DIAGNOSIS — G44209 Tension-type headache, unspecified, not intractable: Secondary | ICD-10-CM | POA: Insufficient documentation

## 2017-08-15 LAB — URINALYSIS, ROUTINE W REFLEX MICROSCOPIC
Bilirubin Urine: NEGATIVE
GLUCOSE, UA: NEGATIVE mg/dL
KETONES UR: NEGATIVE mg/dL
Leukocytes, UA: NEGATIVE
Nitrite: NEGATIVE
PROTEIN: NEGATIVE mg/dL
Specific Gravity, Urine: <1.005 — ABNORMAL LOW (ref 1.005–1.030)
pH: 7 (ref 5.0–8.0)

## 2017-08-15 LAB — BASIC METABOLIC PANEL
ANION GAP: 5 (ref 5–15)
BUN: 9 mg/dL (ref 6–20)
CALCIUM: 9.1 mg/dL (ref 8.9–10.3)
CO2: 28 mmol/L (ref 22–32)
Chloride: 108 mmol/L (ref 101–111)
Creatinine, Ser: 0.8 mg/dL (ref 0.44–1.00)
GFR calc Af Amer: >60 mL/min (ref >60)
GLUCOSE: 90 mg/dL (ref 65–99)
POTASSIUM: 4.1 mmol/L (ref 3.5–5.1)
SODIUM: 141 mmol/L (ref 135–145)

## 2017-08-15 LAB — URINALYSIS, MICROSCOPIC (REFLEX)

## 2017-08-15 LAB — CBC WITH DIFFERENTIAL/PLATELET
BASOS ABS: 0.1 10*3/uL (ref 0.0–0.1)
BASOS PCT: 1 %
EOS ABS: 0.1 10*3/uL (ref 0.0–0.7)
Eosinophils Relative: 1 %
HCT: 42.1 % (ref 36.0–46.0)
HEMOGLOBIN: 14.1 g/dL (ref 12.0–15.0)
Lymphocytes Relative: 50 %
Lymphs Abs: 2.4 10*3/uL (ref 0.7–4.0)
MCH: 29.3 pg (ref 26.0–34.0)
MCHC: 33.5 g/dL (ref 30.0–36.0)
MCV: 87.5 fL (ref 78.0–100.0)
Monocytes Absolute: 0.3 10*3/uL (ref 0.1–1.0)
Monocytes Relative: 6 %
NEUTROS PCT: 42 %
Neutro Abs: 2 10*3/uL (ref 1.7–7.7)
PLATELETS: 255 10*3/uL (ref 150–400)
RBC: 4.81 MIL/uL (ref 3.87–5.11)
RDW: 13.4 % (ref 11.5–15.5)
WBC: 4.8 10*3/uL (ref 4.0–10.5)

## 2017-08-15 MED ORDER — NITROFURANTOIN MONOHYD MACRO 100 MG PO CAPS: 100.0000 mg | ORAL_CAPSULE | Freq: Two times a day (BID) | ORAL | 0 refills | Status: DC

## 2017-08-15 MED ORDER — LISINOPRIL-HYDROCHLOROTHIAZIDE 10-12.5 MG PO TABS: 1.0000 | ORAL_TABLET | Freq: Every day | ORAL | 1 refills | Status: DC

## 2017-08-15 MED ORDER — HYDROCHLOROTHIAZIDE 25 MG PO TABS
12.5000 mg | ORAL_TABLET | Freq: Once | ORAL | Status: AC
  Administered 2017-08-15: 12.5 mg via ORAL

## 2017-08-15 MED ORDER — MORPHINE SULFATE (PF) 4 MG/ML IV SOLN
6.0000 mg | Freq: Once | INTRAVENOUS | Status: AC
  Administered 2017-08-15: 6 mg via INTRAMUSCULAR
  Filled 2017-08-15: qty 2

## 2017-08-15 MED ORDER — HYDROCHLOROTHIAZIDE 12.5 MG PO CAPS
12.5000 mg | ORAL_CAPSULE | Freq: Every day | ORAL | Status: DC
  Filled 2017-08-15: qty 1

## 2017-08-15 MED ORDER — ONDANSETRON 8 MG PO TBDP
8.0000 mg | ORAL_TABLET | Freq: Once | ORAL | Status: AC
  Administered 2017-08-15: 8 mg via ORAL
  Filled 2017-08-15: qty 1

## 2017-08-15 MED ORDER — KETOROLAC TROMETHAMINE 60 MG/2ML IM SOLN
30.0000 mg | Freq: Once | INTRAMUSCULAR | Status: AC
  Administered 2017-08-15: 30 mg via INTRAMUSCULAR
  Filled 2017-08-15: qty 2

## 2017-08-15 MED ORDER — HYDROCHLOROTHIAZIDE 25 MG PO TABS
ORAL_TABLET | ORAL | Status: AC
  Administered 2017-08-15: 12.5 mg via ORAL
  Filled 2017-08-15: qty 1

## 2017-08-15 MED ORDER — LISINOPRIL 10 MG PO TABS
10.0000 mg | ORAL_TABLET | Freq: Once | ORAL | Status: AC
  Administered 2017-08-15: 10 mg via ORAL
  Filled 2017-08-15: qty 1

## 2017-08-15 MED FILL — LISINOPRIL-HCTZ 10-12.5 MG: 10-12.5 | 30 days supply | Qty: 30 | Fill #0

## 2017-08-15 MED FILL — NITROFURANTOIN MONO-MCR 100: 100 | 5 days supply | Qty: 10 | Fill #0

## 2017-08-15 NOTE — ED Triage Notes (Addendum)
Pt has been off her medications for approximately one year.  Pt started with headache two days ago.  Some sob with exertion.  No chest pain.  Pt states this headache is different than her migraine.  She thinks her bp is causing it.

## 2017-08-15 NOTE — ED Provider Notes (Signed)
[facility]   CSN: [id number] Arrival date & time: 08/15/17  1324     History   Chief Complaint Chief Complaint  Patient presents with  . Headache    HPI Cynia Abruzzo is a 51 y.o. female.  HPI Lauretta Sallas is a 51 y.o. female presents to emergency department complaining of a headache. Patient has history of hypertension, migraine headaches, back pain. She states this headache is different from her typical migraines. She reports headache onset 2 days ago, gradual. Pain in the frontal region and radiates to the top of the head. Reports associated photophobia, nausea. Denies changes in vision. No numbness or weakness in extremities. No vomiting. She states that the headache is "pounding" and sometimes feels "pounding in my chest when headache is severe." She has tried taking her regular migraine medications which did not help and take to [facility] powders which also did not provide any relief. She currently does not have a primary care doctor that she can go see, states she does not have insurance or co-pay money. She states that she has been off of her blood pressure medicines for over a year. Reports healthy diet, low sodium. Denies any other complaints.  Past Medical History:  Diagnosis Date  . Asthma   . DDD (degenerative disc disease)   . Hypertension   . Migraine     Patient Active Problem List   Diagnosis Date Noted  . Neck pain 04/02/2013  . MIGRAINE HEADACHE 12/29/2010  . ESSENTIAL HYPERTENSION, BENIGN 12/29/2010  . ELBOW PAIN, RIGHT 12/29/2010  . BACK PAIN 12/29/2010    Past Surgical History:  Procedure Laterality Date  . ABDOMINAL HYSTERECTOMY    . APPENDECTOMY    . CHOLECYSTECTOMY      OB History    No data available       Home Medications    Prior to Admission medications   Medication Sig Start Date End Date Taking? Authorizing Provider  albuterol (PROVENTIL HFA;VENTOLIN HFA) 108 (90 BASE) MCG/ACT inhaler Inhale 2 puffs into  the lungs every 4 (four) hours as needed for wheezing. 09/22/11 08/15/17 Yes Rancour, Jeannett Senior, MD  b complex vitamins tablet Take 1 tablet by mouth daily.   Yes [provider]  lisinopril-hydrochlorothiazide (PRINZIDE,ZESTORETIC) 10-12.5 MG per tablet Take 1 tablet by mouth daily.    [provider]  Multiple Vitamin (MULITIVITAMIN WITH MINERALS) TABS Take 1 tablet by mouth daily.    [provider]    Family History Family History  Problem Relation Age of Onset  . Hypertension Mother   . Diabetes Father   . Hypertension Father   . Heart attack Neg Hx     Social History Social History  Substance Use Topics  . Smoking status: Never Smoker  . Smokeless tobacco: Never Used  . Alcohol use Yes     Comment: occasional     Allergies   Patient has no known allergies.   Review of Systems Review of Systems  Constitutional: Negative for chills and fever.  Respiratory: Negative for cough, chest tightness and shortness of breath.   Cardiovascular: Positive for chest pain. Negative for palpitations and leg swelling.  Gastrointestinal: Positive for nausea. Negative for abdominal pain, diarrhea and vomiting.  Genitourinary: Negative for dysuria and flank pain.  Musculoskeletal: Negative for arthralgias, myalgias, neck pain and neck stiffness.  Skin: Negative for rash.  Neurological: Positive for headaches. Negative for dizziness and weakness.  All other systems reviewed and are negative.    Physical  Exam Updated Vital Signs BP (!) 170/97 (BP Location: Right Arm) Comment: Out of BP meds/ RN M Sims aware  Pulse 79   Temp 98.8 F (37.1 C) (Oral)   Resp 14   Ht  (1.575 m)   Wt 72.6 kg (160 lb)   SpO2 99%   BMI 29.26 kg/m   Physical Exam  Constitutional: She is oriented to person, place, and time. She appears well-developed and well-nourished. No distress.  HENT:  Head: Normocephalic and atraumatic.  Eyes: Pupils are equal, round, and reactive to  light. Conjunctivae and EOM are normal.  Neck: Neck supple.  Cardiovascular: Normal rate, regular rhythm and normal heart sounds.   Pulmonary/Chest: Effort normal and breath sounds normal. No respiratory distress. She has no wheezes. She has no rales.  Abdominal: Soft. Bowel sounds are normal. She exhibits no distension. There is no tenderness. There is no rebound.  Musculoskeletal: She exhibits no edema.  Neurological: She is alert and oriented to person, place, and time.  5/5 and equal upper and lower extremity strength bilaterally. Equal grip strength bilaterally. Normal finger to nose and heel to shin. No pronator drift.   Skin: Skin is warm and dry.  Psychiatric: She has a normal mood and affect. Her behavior is normal.  Nursing note and vitals reviewed.    ED Treatments / Results  Labs (all labs ordered are listed, but only abnormal results are displayed) Labs Reviewed  URINALYSIS, ROUTINE W REFLEX MICROSCOPIC - Abnormal; Notable for the following:       Result Value   APPearance CLOUDY (*)    Specific Gravity, Urine <1.005 (*)    Hgb urine dipstick TRACE (*)    All other components within normal limits  URINALYSIS, MICROSCOPIC (REFLEX) - Abnormal; Notable for the following:    Bacteria, UA MANY (*)    Squamous Epithelial / LPF 0-5 (*)    All other components within normal limits  CBC WITH DIFFERENTIAL/PLATELET  BASIC METABOLIC PANEL    EKG  EKG Interpretation None       Radiology No results found.  Procedures Procedures (including critical care time)  Medications Ordered in ED Medications  ketorolac (TORADOL) injection 30 mg (not administered)  lisinopril (PRINIVIL,ZESTRIL) tablet 10 mg (not administered)  hydrochlorothiazide (MICROZIDE) capsule 12.5 mg (not administered)     Initial Impression / Assessment and Plan / ED Course  I have reviewed the triage vital signs and the nursing notes.  Pertinent labs & imaging results that were available during my  care of the patient were reviewed by me and considered in my medical decision making (see chart for details).     Patient in emergency department with gradual onset of headache 2 days ago. History of migraines, however states this is different. Normal neurological exam. Blood pressure is elevated at 170/97. Will  restart her regular medications, check CBC and bmet. Patient is complaining of some chest pounding, states it's only when her headache is severe. No exertional symptoms. No chest pain at this time. Will check EKG. Pain is atypical, no history of coronary artery disease. Will give Toradol for headache.  Headache mildly improved with Toradol. Labs are normal. Patient again states that this does not feel like her typical migraine, and when offered some migraine medication stated that she does not think it will help. She is still having a headache. Will give a dose of morphine IM for pain control. I suspect her headache could be caused by elevated blood pressure. There is  no neurological complaints or physical exam findings. Do not think she needs any imaging. She is afebrile. Her headache sounds like tension type headache.  Patient feels better. Discussed plan to discharge home, restart blood pressure medications. Will give referral to [facility] and wellness clinic. Return precautions discussed.   Vitals:   08/15/17 0848 08/15/17 1000 08/15/17 1100  BP: (!) 170/97 (!) 158/96 (!) 162/96  Pulse: 79 65 66  Resp: Temp: 98.8 F (37.1 C)    TempSrc: Oral    SpO2: 99% 100% 99%  Weight: 72.6 kg (160 lb)    Height:  (1.575 m)       Final Clinical Impressions(s) / ED Diagnoses   Final diagnoses:  Acute non intractable tension-type headache  Secondary hypertension    New Prescriptions Current Discharge Medication List       Jaynie Crumble, PA-C 08/15/17 1140    Benjiman Core, MD 08/15/17 1537

## 2017-08-15 NOTE — Discharge Instructions (Signed)
Continue tylenol or motrin for headache. Take blood pressure medications as prescribed. Make sure to get good rest, drink plenty of fluids, follow up with family doctor

## 2017-10-13 ENCOUNTER — Encounter: Payer: Self-pay | Admitting: Internal Medicine

## 2017-10-13 ENCOUNTER — Ambulatory Visit: Payer: Self-pay | Attending: Internal Medicine | Admitting: Internal Medicine

## 2017-10-13 VITALS — BP 137/82 | HR 84 | Temp 99.2°F | Resp 16 | Ht 62.0 in | Wt 177.2 lb

## 2017-10-13 DIAGNOSIS — I10 Essential (primary) hypertension: Secondary | ICD-10-CM | POA: Insufficient documentation

## 2017-10-13 DIAGNOSIS — Z79899 Other long term (current) drug therapy: Secondary | ICD-10-CM | POA: Insufficient documentation

## 2017-10-13 DIAGNOSIS — Z1239 Encounter for other screening for malignant neoplasm of breast: Secondary | ICD-10-CM

## 2017-10-13 DIAGNOSIS — J4520 Mild intermittent asthma, uncomplicated: Secondary | ICD-10-CM | POA: Insufficient documentation

## 2017-10-13 DIAGNOSIS — N951 Menopausal and female climacteric states: Secondary | ICD-10-CM | POA: Insufficient documentation

## 2017-10-13 DIAGNOSIS — Z1231 Encounter for screening mammogram for malignant neoplasm of breast: Secondary | ICD-10-CM

## 2017-10-13 DIAGNOSIS — N898 Other specified noninflammatory disorders of vagina: Secondary | ICD-10-CM | POA: Insufficient documentation

## 2017-10-13 DIAGNOSIS — G43819 Other migraine, intractable, without status migrainosus: Secondary | ICD-10-CM

## 2017-10-13 DIAGNOSIS — R232 Flushing: Secondary | ICD-10-CM | POA: Insufficient documentation

## 2017-10-13 DIAGNOSIS — G43809 Other migraine, not intractable, without status migrainosus: Secondary | ICD-10-CM | POA: Insufficient documentation

## 2017-10-13 MED ORDER — ALBUTEROL SULFATE HFA 108 (90 BASE) MCG/ACT IN AERS: 2.0000 | INHALATION_SPRAY | RESPIRATORY_TRACT | 6 refills | Status: AC | PRN

## 2017-10-13 MED ORDER — LISINOPRIL-HYDROCHLOROTHIAZIDE 10-12.5 MG PO TABS: 1.0000 | ORAL_TABLET | Freq: Every day | ORAL | 1 refills | Status: DC

## 2017-10-13 MED ORDER — METRONIDAZOLE 500 MG PO TABS: 500.0000 mg | ORAL_TABLET | Freq: Two times a day (BID) | ORAL | 0 refills | Status: DC

## 2017-10-13 MED ORDER — ESTRADIOL 0.075 MG/24HR TD PTWK: 0.0750 mg | MEDICATED_PATCH | TRANSDERMAL | 5 refills | Status: AC

## 2017-10-13 NOTE — Progress Notes (Signed)
Patient ID: Veronica Braun, female    DOB: 09/29/1966  MRN: [id number]  CC: Hospitalization Follow-up   Subjective: Veronica Braun is a 51 y.o. female who presents for new pt visit. PCP was [facility] Debbor[facility]. Last seen sometimes last yr Her concerns today include:  Pt with hx of HTN, migraines, menopausal and asthma  1. HTN: dx in 2008 out of Lis/HCTZ x 1 mth -checks BP when she has HA. Last wk it was 160/90 -limits salt in foods -not getting in much exercise. Currently in school online -some LE edema.  -gets anxiety attacks occasionally where she feels dizzy and chest tightness. Occurs if she is stressed like trying to complete a large school assigment  2. Hx of Asthma: no recent flares. Triggers are season changes and when around people who smoke.  -does not have to use Albuterol every day. Never hosp for asthma  3. Hx of migraines:  -food triggers include caffaine, red wine. Certain smells also trigger. -was seeing a neurologist, [place] Okey Dupreose, [facility]  Hot flashes: had TAH and BLO at age 51 for menorrhagia from fibroids and for ovarian cyst. She was on HRT patches. Out x 2 mths. Would like RF. Hot flashes bothersome -last MMG was last yr. Had neg bx LT breast 4-5 yrs ago.  -having clear vaginal discgh; has recurrent BV which she thinks she has now. Sexually active with one partner for past 20 yrs Due for MMG Patient Active Problem List   Diagnosis Date Noted  . Neck pain 04/02/2013  . MIGRAINE HEADACHE 12/29/2010  . ESSENTIAL HYPERTENSION, BENIGN 12/29/2010  . ELBOW PAIN, RIGHT 12/29/2010  . BACK PAIN 12/29/2010     Current Outpatient Medications on File Prior to Visit  Medication Sig Dispense Refill  . b complex vitamins tablet Take 1 tablet by mouth daily.    Marland Kitchen. lisinopril-hydrochlorothiazide (PRINZIDE,ZESTORETIC) 10-12.5 MG tablet Take 1 tablet by mouth daily. 30 tablet 1  . Multiple Vitamin (MULITIVITAMIN WITH MINERALS) TABS Take 1 tablet by  mouth daily.    Marland Kitchen. albuterol (PROVENTIL HFA;VENTOLIN HFA) 108 (90 BASE) MCG/ACT inhaler Inhale 2 puffs into the lungs every 4 (four) hours as needed for wheezing. 1 Inhaler 0  . nitrofurantoin, macrocrystal-monohydrate, (MACROBID) 100 MG capsule Take 1 capsule (100 mg total) by mouth 2 (two) times daily. (Patient not taking: Reported on 10/13/2017) 10 capsule 0   No current facility-administered medications on file prior to visit.     No Known Allergies  Social History   Socioeconomic History  . Marital status: Single    Spouse name: Not on file  . Number of children: Not on file  . Years of education: Not on file  . Highest education level: Not on file  Social Needs  . Financial resource strain: Not on file  . Food insecurity - worry: Not on file  . Food insecurity - inability: Not on file  . Transportation needs - medical: Not on file  . Transportation needs - non-medical: Not on file  Occupational History  . Not on file  Tobacco Use  . Smoking status: Never Smoker  . Smokeless tobacco: Never Used  Substance and Sexual Activity  . Alcohol use: Yes    Comment: occasional  . Drug use: No  . Sexual activity: Yes    Birth control/protection: Surgical  Other Topics Concern  . Not on file  Social History Narrative  . Not on file    Family History  Problem Relation  Age of Onset  . Hypertension Mother   . Diabetes Father   . Hypertension Father   . Heart attack Neg Hx     Past Surgical History:  Procedure Laterality Date  . ABDOMINAL HYSTERECTOMY    . APPENDECTOMY    . CHOLECYSTECTOMY      ROS: Review of Systems  Constitutional: Negative for activity change and fatigue.  Cardiovascular: Negative for palpitations and leg swelling.  Genitourinary: Positive for vaginal discharge. Negative for dysuria.  Neurological: Negative for dizziness.  Psychiatric/Behavioral: The patient is nervous/anxious.     PHYSICAL EXAM: BP 137/82 (BP Location: Left Arm, Patient  Position: Sitting, Cuff Size: Normal)   Pulse 84   Temp 99.2 F (37.3 C) (Oral)   Resp 16   Ht 5\' 2"  (1.575 m)   Wt 177 lb 3.2 oz (80.4 kg)   SpO2 97%   BMI 32.41 kg/m   Wt Readings from Last 3 Encounters:  10/13/17 177 lb 3.2 oz (80.4 kg)  08/15/17 160 lb (72.6 kg)  01/25/17 163 lb (73.9 kg)  Repeat BP 170/90  Physical Exam  General appearance - alert, well appearing, middle age AAF and in no distress Mental status - alert, oriented to person, place, and time, normal mood, behavior, speech, dress, motor activity, and thought processes Eyes - pupils equal and reactive, extraocular eye movements intact Mouth - mucous membranes moist, pharynx normal without lesions Neck - supple, no significant adenopathy Chest - clear to auscultation, no wheezes, rales or rhonchi, symmetric air entry Heart - normal rate, regular rhythm, normal S1, S2, no murmurs, rubs, clicks or gallops Extremities - peripheral pulses normal, no pedal edema, no clubbing or cyanosis   [facility] 10/13/2017  Decreased Interest 0  Down, Depressed, Hopeless 0  PHQ - 2 Score 0  Altered sleeping 0  Tired, decreased energy 1  Change in appetite 0  Feeling bad or failure about yourself  0  Trouble concentrating 1  Moving slowly or fidgety/restless 0  Suicidal thoughts 0  PHQ-9 Score 2   Lab Results  Component Value Date   WBC 4.8 08/15/2017   HGB 14.1 08/15/2017   HCT 42.1 08/15/2017   MCV 87.5 08/15/2017   PLT 255 08/15/2017     Chemistry      Component Value Date/Time   NA 141 08/15/2017 0936   K 4.1 08/15/2017 0936   CL 108 08/15/2017 0936   CO2 28 08/15/2017 0936   BUN 9 08/15/2017 0936   CREATININE 0.80 08/15/2017 0936      Component Value Date/Time   CALCIUM 9.1 08/15/2017 0936   ALKPHOS 96 10/20/2014 0945   AST 16 10/20/2014 0945   ALT 12 10/20/2014 0945   BILITOT 0.2 (L) 10/20/2014 0945       ASSESSMENT AND PLAN: 1. Essential hypertension RF Lis/HCTZ DASH diet -  lisinopril-hydrochlorothiazide (PRINZIDE,ZESTORETIC) 10-12.5 MG tablet; Take 1 tablet by mouth daily.  Dispense: 30 tablet; Refill: 1  2. Mild intermittent asthma without complication Avoid triggers - albuterol (PROVENTIL HFA;VENTOLIN HFA) 108 (90 Base) MCG/ACT inhaler; Inhale 2 puffs into the lungs every 4 (four) hours as needed for wheezing.  Dispense: 1 Inhaler; Refill: 6  3. Hot flashes - estradiol (CLIMARA) 0.075 mg/24hr patch; Place 1 patch (0.075 mg total) onto the skin once a week.  Dispense: 4 patch; Refill: 5  4. Vaginal discharge Pt to do self swab. Flagyl given empirically - metroNIDAZOLE (FLAGYL) 500 MG tablet; Take 1 tablet (500 mg total) by mouth  2 (two) times daily.  Dispense: 14 tablet; Refill: 0 - Cervicovaginal ancillary only  5. Breast cancer screening - MM Digital Screening; Future  6. Other migraine without status migrainosus, intractable   Patient was given the opportunity to ask questions.  Patient verbalized understanding of the plan and was able to repeat key elements of the plan.   No orders of the defined types were placed in this encounter.    Requested Prescriptions    No prescriptions requested or ordered in this encounter    F/u in 3 mths Jonah Blue, MD, Jerrel Ivory

## 2017-10-13 NOTE — Progress Notes (Signed)
Patient is here for a hospitalization follow up. Patient stated she need medication refills for Hypertension and inhaler. Patient stated that she may need antibiotic because she is having vaginal discharge and odor.

## 2017-10-13 NOTE — Patient Instructions (Signed)
Try to get in some form of aerobic exercise 3-4 times a week for 30 minutes.  Menopause Menopause is the normal time of life when menstrual periods stop completely. Menopause is complete when you have missed 12 consecutive menstrual periods. It usually occurs between the ages of 48 years and 55 years. Very rarely does a woman develop menopause before the age of 40 years. At menopause, your ovaries stop producing the female hormones estrogen and progesterone. This can cause undesirable symptoms and also affect your health. Sometimes the symptoms may occur 4-5 years before the menopause begins. There is no relationship between menopause and:  Oral contraceptives.  Number of children you had.  Race.  The age your menstrual periods started (menarche).  Heavy smokers and very thin women may develop menopause earlier in life. What are the causes?  The ovaries stop producing the female hormones estrogen and progesterone. Other causes include:  Surgery to remove both ovaries.  The ovaries stop functioning for no known reason.  Tumors of the pituitary gland in the brain.  Medical disease that affects the ovaries and hormone production.  Radiation treatment to the abdomen or pelvis.  Chemotherapy that affects the ovaries.  What are the signs or symptoms?  Hot flashes.  Night sweats.  Decrease in sex drive.  Vaginal dryness and thinning of the vagina causing painful intercourse.  Dryness of the skin and developing wrinkles.  Headaches.  Tiredness.  Irritability.  Memory problems.  Weight gain.  Bladder infections.  Hair growth of the face and chest.  Infertility. More serious symptoms include:  Loss of bone (osteoporosis) causing breaks (fractures).  Depression.  Hardening and narrowing of the arteries (atherosclerosis) causing heart attacks and strokes.  How is this diagnosed?  When the menstrual periods have stopped for 12 straight months.  Physical  exam.  Hormone studies of the blood. How is this treated? There are many treatment choices and nearly as many questions about them. The decisions to treat or not to treat menopausal changes is an individual choice made with your health care provider. Your health care provider can discuss the treatments with you. Together, you can decide which treatment will work best for you. Your treatment choices may include:  Hormone therapy (estrogen and progesterone).  Non-hormonal medicines.  Treating the individual symptoms with medicine (for example antidepressants for depression).  Herbal medicines that may help specific symptoms.  Counseling by a psychiatrist or psychologist.  Group therapy.  Lifestyle changes including: ? Eating healthy. ? Regular exercise. ? Limiting caffeine and alcohol. ? Stress management and meditation.  No treatment.  Follow these instructions at home:  Take the medicine your health care provider gives you as directed.  Get plenty of sleep and rest.  Exercise regularly.  Eat a diet that contains calcium (good for the bones) and soy products (acts like estrogen hormone).  Avoid alcoholic beverages.  Do not smoke.  If you have hot flashes, dress in layers.  Take supplements, calcium, and vitamin D to strengthen bones.  You can use over-the-counter lubricants or moisturizers for vaginal dryness.  Group therapy is sometimes very helpful.  Acupuncture may be helpful in some cases. Contact a health care provider if:  You are not sure you are in menopause.  You are having menopausal symptoms and need advice and treatment.  You are still having menstrual periods after age 52 years.  You have pain with intercourse.  Menopause is complete (no menstrual period for 12 months) and you develop vaginal bleeding.  You need a referral to a specialist (gynecologist, psychiatrist, or psychologist) for treatment. Get help right away if:  You have severe  depression.  You have excessive vaginal bleeding.  You fell and think you have a broken bone.  You have pain when you urinate.  You develop leg or chest pain.  You have a fast pounding heart beat (palpitations).  You have severe headaches.  You develop vision problems.  You feel a lump in your breast.  You have abdominal pain or severe indigestion. This information is not intended to replace advice given to you by your health care provider. Make sure you discuss any questions you have with your health care provider. Document Released: 01/21/2004 Document Revised: 04/07/2016 Document Reviewed: 05/30/2013 Elsevier Interactive Patient Education  2017 [employer]

## 2017-10-16 LAB — CERVICOVAGINAL ANCILLARY ONLY: Bacterial vaginitis: POSITIVE — AB

## 2017-10-17 ENCOUNTER — Telehealth: Payer: Self-pay

## 2017-10-17 DIAGNOSIS — N898 Other specified noninflammatory disorders of vagina: Secondary | ICD-10-CM

## 2017-10-17 MED ORDER — METRONIDAZOLE 500 MG PO TABS: 500.0000 mg | ORAL_TABLET | Freq: Two times a day (BID) | ORAL | 0 refills | Status: DC

## 2017-10-17 NOTE — Telephone Encounter (Signed)
Flagyl rxn sent to our pharmacy per pt's request.

## 2017-10-17 NOTE — Telephone Encounter (Signed)
-----   Message from Deborah B Johnson, MD sent at 10/16/2017  5:52 PM EST ----- Let pt know she did test positive for Bacterial vaginosis. We already gave her prescription for Flagyl. 

## 2017-10-17 NOTE — Telephone Encounter (Signed)
-----   Message from Marcine Matareborah B Johnson, MD sent at 10/16/2017  5:52 PM EST ----- Let pt know she did test positive for Bacterial vaginosis. We already gave her prescription for Flagyl.

## 2017-10-17 NOTE — Telephone Encounter (Signed)
Patient inform on lab result.  Patient verified DOB.  

## 2017-10-17 NOTE — Addendum Note (Signed)
Addended by: Jonah BlueJOHNSON, Jessamine Barcia B on: 10/17/2017 05:56 PM   Modules accepted: Orders

## 2017-12-25 ENCOUNTER — Telehealth: Payer: Self-pay | Admitting: Internal Medicine

## 2017-12-25 DIAGNOSIS — I10 Essential (primary) hypertension: Secondary | ICD-10-CM

## 2017-12-25 MED ORDER — LISINOPRIL-HYDROCHLOROTHIAZIDE 10-12.5 MG PO TABS: 1.0000 | ORAL_TABLET | Freq: Every day | ORAL | 1 refills | Status: AC

## 2017-12-25 NOTE — Addendum Note (Signed)
Addended by: Jonah BlueJOHNSON, Shazia Mitchener B on: 12/25/2017 08:48 PM   Modules accepted: Orders

## 2017-12-25 NOTE — Telephone Encounter (Signed)
RF sent on Lisinopril/HCTZ.

## 2017-12-25 NOTE — Telephone Encounter (Signed)
Pt called to request a refill on -lisinopril-hydrochlorothiazide (PRINZIDE,ZESTORETIC) 10-12.5 MG tablet She states she is completely out of this medicatio If approved please send it to  -[id number] - [place], [place] - [address] Please follow up

## 2018-01-09 ENCOUNTER — Ambulatory Visit: Payer: Self-pay | Admitting: Internal Medicine

## 2018-04-04 ENCOUNTER — Encounter (HOSPITAL_BASED_OUTPATIENT_CLINIC_OR_DEPARTMENT_OTHER): Payer: Self-pay

## 2018-04-04 ENCOUNTER — Other Ambulatory Visit: Payer: Self-pay

## 2018-04-04 ENCOUNTER — Emergency Department (HOSPITAL_BASED_OUTPATIENT_CLINIC_OR_DEPARTMENT_OTHER)
Admission: EM | Admit: 2018-04-04 | Discharge: 2018-04-04 | Disposition: A | Discharge status: Home / Self Care | Payer: Self-pay | Attending: Emergency Medicine | Admitting: Emergency Medicine

## 2018-04-04 DIAGNOSIS — I10 Essential (primary) hypertension: Secondary | ICD-10-CM | POA: Insufficient documentation

## 2018-04-04 DIAGNOSIS — R51 Headache: Secondary | ICD-10-CM | POA: Insufficient documentation

## 2018-04-04 DIAGNOSIS — R42 Dizziness and giddiness: Secondary | ICD-10-CM | POA: Insufficient documentation

## 2018-04-04 DIAGNOSIS — J45909 Unspecified asthma, uncomplicated: Secondary | ICD-10-CM | POA: Insufficient documentation

## 2018-04-04 DIAGNOSIS — R519 Headache, unspecified: Secondary | ICD-10-CM

## 2018-04-04 DIAGNOSIS — Z79899 Other long term (current) drug therapy: Secondary | ICD-10-CM | POA: Insufficient documentation

## 2018-04-04 HISTORY — DX: Dizziness and giddiness: R42

## 2018-04-04 LAB — TROPONIN I: Troponin I: <0.03 ng/mL

## 2018-04-04 LAB — BASIC METABOLIC PANEL
Anion gap: 11 (ref 5–15)
BUN: 11 mg/dL (ref 6–20)
CHLORIDE: 104 mmol/L (ref 101–111)
CO2: 25 mmol/L (ref 22–32)
Calcium: 9.3 mg/dL (ref 8.9–10.3)
Creatinine, Ser: 0.97 mg/dL (ref 0.44–1.00)
GFR calc Af Amer: >60 mL/min (ref >60)
GLUCOSE: 73 mg/dL (ref 65–99)
POTASSIUM: 3.3 mmol/L — AB (ref 3.5–5.1)
Sodium: 140 mmol/L (ref 135–145)

## 2018-04-04 LAB — CBC WITH DIFFERENTIAL/PLATELET
Basophils Absolute: 0 10*3/uL (ref 0.0–0.1)
Basophils Relative: 1 %
Eosinophils Absolute: 0.1 10*3/uL (ref 0.0–0.7)
Eosinophils Relative: 1 %
HCT: 42.3 % (ref 36.0–46.0)
Hemoglobin: 14.3 g/dL (ref 12.0–15.0)
Lymphocytes Relative: 45 %
Lymphs Abs: 2.9 10*3/uL (ref 0.7–4.0)
MCH: 29.9 pg (ref 26.0–34.0)
MCHC: 33.8 g/dL (ref 30.0–36.0)
MCV: 88.3 fL (ref 78.0–100.0)
Monocytes Absolute: 0.5 10*3/uL (ref 0.1–1.0)
Monocytes Relative: 8 %
Neutro Abs: 2.8 10*3/uL (ref 1.7–7.7)
Neutrophils Relative %: 45 %
Platelets: 263 10*3/uL (ref 150–400)
RBC: 4.79 MIL/uL (ref 3.87–5.11)
RDW: 13.3 % (ref 11.5–15.5)
WBC: 6.3 10*3/uL (ref 4.0–10.5)

## 2018-04-04 MED ORDER — DIPHENHYDRAMINE HCL 50 MG/ML IJ SOLN
12.5000 mg | Freq: Once | INTRAMUSCULAR | Status: AC
  Administered 2018-04-04: 12.5 mg via INTRAVENOUS
  Filled 2018-04-04: qty 1

## 2018-04-04 MED ORDER — KETOROLAC TROMETHAMINE 30 MG/ML IJ SOLN
30.0000 mg | Freq: Once | INTRAMUSCULAR | Status: AC
Start: 2018-04-04 — End: 2018-04-04
  Administered 2018-04-04: 30 mg via INTRAVENOUS
  Filled 2018-04-04: qty 1

## 2018-04-04 MED ORDER — PROCHLORPERAZINE EDISYLATE 10 MG/2ML IJ SOLN
10.0000 mg | Freq: Once | INTRAMUSCULAR | Status: AC
  Administered 2018-04-04: 10 mg via INTRAVENOUS
  Filled 2018-04-04: qty 2

## 2018-04-04 MED ORDER — MECLIZINE HCL 25 MG PO TABS
25.0000 mg | ORAL_TABLET | Freq: Once | ORAL | Status: AC
  Administered 2018-04-04: 25 mg via ORAL
  Filled 2018-04-04: qty 1

## 2018-04-04 MED ORDER — SODIUM CHLORIDE 0.9 % IV BOLUS
1000.0000 mL | Freq: Once | INTRAVENOUS | Status: AC
  Administered 2018-04-04: 1000 mL via INTRAVENOUS

## 2018-04-04 NOTE — ED Notes (Signed)
Pt left before receiving d/c papers but d/c was discussed by EDP

## 2018-04-04 NOTE — ED Triage Notes (Signed)
Pt c/o headache, dizziness, "room spinning," nausea and central chest pain since last night

## 2018-04-04 NOTE — ED Notes (Signed)
Patient up and ambulated to bathroom without difficulty; patient states feeling much better; states dizziness is resolved.

## 2018-04-04 NOTE — ED Notes (Signed)
Urine in lab 

## 2018-04-04 NOTE — ED Provider Notes (Signed)
[facility] [place] EMERGENCY DEPARTMENT Provider Note   CSN: [id number] Arrival date & time: 04/04/18  1533     History   Chief Complaint Chief Complaint  Patient presents with  . Dizziness    HPI Veronica Braun is a 52 y.o. female.  The history is provided by the patient and medical records. No language interpreter was used.  Dizziness  Associated symptoms: chest pain, headaches and nausea   Associated symptoms: no blood in stool, no diarrhea, no palpitations, no shortness of breath, no vomiting and no weakness    Veronica Braun is a 52 y.o. female  with a PMH of migraines, vertigo, HTN who presents to the Emergency Department complaining of dizziness which began yesterday. She describes this as "everything in the room spinning". Worse with movement of her head as well as certain smells. Dizziness feels similar to her previous episodes of vertigo. She also reports nausea and generalized headache which is c/w her typical migraines. She tried Aleve and Ibuprofen with little improvement. This will typically help her headaches. While she has had similar experiences with both vertigo and migraines, she has never had both simultaneously.  No visual changes, numbness, tingling or weakness.  No difficulty with her speech or ambulation.  She additionally reports central chest pain which began about a week ago. It has been intermittent.  Not exertional.  No alleviating or aggravating factors noted.  No associated shortness of breath, diaphoresis, vomiting, back pain.  She has not had any nausea until her dizziness and headache began yesterday.   Past Medical History:  Diagnosis Date  . Asthma   . DDD (degenerative disc disease)   . Hypertension   . Migraine   . Vertigo     Patient Active Problem List   Diagnosis Date Noted  . Mild intermittent asthma without complication 10/13/2017  . Hot flashes 10/13/2017  . Migraine 12/29/2010  . Essential hypertension 12/29/2010    Past  Surgical History:  Procedure Laterality Date  . ABDOMINAL HYSTERECTOMY    . APPENDECTOMY    . CHOLECYSTECTOMY       OB History   None      Home Medications    Prior to Admission medications   Medication Sig Start Date End Date Taking? Authorizing Provider  albuterol (PROVENTIL HFA;VENTOLIN HFA) 108 (90 Base) MCG/ACT inhaler Inhale 2 puffs into the lungs every 4 (four) hours as needed for wheezing. 10/13/17 09/06/23  Marcine Matar, MD  b complex vitamins tablet Take 1 tablet by mouth daily.    [provider]  estradiol (CLIMARA) 0.075 mg/24hr patch Place 1 patch (0.075 mg total) onto the skin once a week. 10/13/17   Marcine Matar, MD  lisinopril-hydrochlorothiazide (PRINZIDE,ZESTORETIC) 10-12.5 MG tablet Take 1 tablet by mouth daily. 12/25/17   Marcine Matar, MD  metroNIDAZOLE (FLAGYL) 500 MG tablet Take 1 tablet (500 mg total) by mouth 2 (two) times daily. 10/17/17   Marcine Matar, MD  Multiple Vitamin (MULITIVITAMIN WITH MINERALS) TABS Take 1 tablet by mouth daily.    [provider]    Family History Family History  Problem Relation Age of Onset  . Hypertension Mother   . Diabetes Father   . Hypertension Father   . Heart attack Neg Hx     Social History Social History   Tobacco Use  . Smoking status: Never Smoker  . Smokeless tobacco: Never Used  Substance Use Topics  . Alcohol use: Yes    Comment: occasional  .  Drug use: No     Allergies   Patient has no known allergies.   Review of Systems Review of Systems  Eyes: Negative for visual disturbance.  Respiratory: Negative for shortness of breath.   Cardiovascular: Positive for chest pain. Negative for palpitations and leg swelling.  Gastrointestinal: Positive for nausea. Negative for abdominal pain, blood in stool, constipation, diarrhea and vomiting.  Neurological: Positive for dizziness and headaches. Negative for weakness and numbness.  All other systems reviewed and  are negative.    Physical Exam Updated Vital Signs BP 127/76 (BP Location: Right Arm)   Pulse 65   Temp 98.2 F (36.8 C) (Oral)   Resp 16   Ht  (1.575 m)   Wt 80.3 kg (177 lb)   SpO2 100%   BMI 32.37 kg/m   Physical Exam  Constitutional: She is oriented to person, place, and time. She appears well-developed and well-nourished. No distress.  HENT:  Head: Normocephalic and atraumatic.  Mouth/Throat: Oropharynx is clear and moist.  No tenderness of the temporal artery.  Eyes: Pupils are equal, round, and reactive to light. Conjunctivae are normal. No scleral icterus.  EOM intact - patient becomes very dizzy with horizontal eye movements.   Neck: Normal range of motion. Neck supple.  Full active and passive ROM without pain.  No midline or paraspinal tenderness. No nuchal rigidity or meningeal signs.  Cardiovascular: Normal rate, regular rhythm, normal heart sounds and intact distal pulses.  Pulmonary/Chest: Effort normal and breath sounds normal. No respiratory distress. She has no wheezes. She has no rales.  Abdominal: Soft. Bowel sounds are normal. She exhibits no distension. There is no tenderness. There is no rebound and no guarding.  Musculoskeletal: Normal range of motion.  Lymphadenopathy:    She has no cervical adenopathy.  Neurological: She is alert and oriented to person, place, and time. She has normal reflexes. No cranial nerve deficit. Coordination normal.  Alert, oriented, thought content appropriate, able to give a coherent history. Speech is clear and goal oriented, able to follow commands.  Cranial Nerves:  II:  Peripheral visual fields grossly normal, pupils equal, round, reactive to light III, IV, VI: EOM intact bilaterally, ptosis not present V,VII: smile symmetric, eyes kept closed tightly against resistance, facial light touch sensation equal VIII: hearing grossly normal IX, X: symmetric soft palate movement, uvula elevates symmetrically  XI:  bilateral shoulder shrug symmetric and strong XII: midline tongue extension 5/5 muscle strength in upper and lower extremities bilaterally including strong and equal grip strength and dorsiflexion/plantar flexion Sensory to light touch normal in all four extremities.  Normal finger-to-nose and rapid alternating movements. No drift.  Skin: Skin is warm and dry. No rash noted. She is not diaphoretic.  Nursing note and vitals reviewed.    ED Treatments / Results  Labs (all labs ordered are listed, but only abnormal results are displayed) Labs Reviewed  BASIC METABOLIC PANEL - Abnormal; Notable for the following components:      Result Value   Potassium 3.3 (*)    All other components within normal limits  CBC WITH DIFFERENTIAL/PLATELET  TROPONIN I    EKG None  ED ECG REPORT   Date: 04/04/2018  Rate: 74  Rhythm: normal sinus rhythm  QRS Axis: normal  Intervals: normal  ST/T Wave abnormalities: nonspecific T wave changes  Conduction Disutrbances:none  Narrative Interpretation:   I have personally reviewed the EKG tracing with attending, Dr. Anitra Lauth and agree with the computerized printout as noted.  Radiology  No results found.  Procedures Procedures (including critical care time)  Medications Ordered in ED Medications  sodium chloride 0.9 % bolus 1,000 mL (0 mLs Intravenous Stopped 04/04/18 1810)  ketorolac (TORADOL) 30 MG/ML injection 30 mg (30 mg Intravenous Given 04/04/18 1657)  prochlorperazine (COMPAZINE) injection 10 mg (10 mg Intravenous Given 04/04/18 1657)  diphenhydrAMINE (BENADRYL) injection 12.5 mg (12.5 mg Intravenous Given 04/04/18 1657)  meclizine (ANTIVERT) tablet 25 mg (25 mg Oral Given 04/04/18 1657)     Initial Impression / Assessment and Plan / ED Course  I have reviewed the triage vital signs and the nursing notes.  Pertinent labs & imaging results that were available during my care of the patient were reviewed by me and considered in my medical  decision making (see chart for details).    Veronica Braun is a 52 y.o. female who presents to ED for headache c/w their typical migraine as well as room-spinning dizziness which she reports is c/w her typical vertigo. While both headache and vertigo sxs are typical for her, she has never experienced both simultaneously. She also endorsed central chest pain which has been intermittent for several days. Normal cardiopulmonary exam. No focal neuro deficits on exam. Meclizine, migraine cocktail and fluids given. Will obtain ekg, labs and reassess.   Labs reviewed and reassuring including normal troponin.  EKG reviewed with attending with no ischemic changes.  On re-evaluation, patient feels much improved.  She is ambulating in the emergency department without any difficulty.  Headache has resolved and dizziness much improved.  She feels comfortable going home.  The patient denies any neurologic symptoms such as visual changes, focal numbness/weakness, balance problems, confusion, or speech difficulty to suggest a life-threatening intracranial process such as intracranial hemorrhage or mass. The patient has no clotting risk factors thus venous sinus thrombosis is unlikely. PCP follow up strongly encouraged. I have reviewed return precautions including development of neurologic symptoms, confusion, lethargy, difficulty speaking, or new/worsening/concerning symptoms. All questions answered.   Patient discussed with Dr. Anitra Lauth who agrees with treatment plan.    Final Clinical Impressions(s) / ED Diagnoses   Final diagnoses:  Dizziness  Bad headache    ED Discharge Orders    None       Veronica Braun, Chase Picket, PA-C 04/04/18 Nicholos Johns    Gwyneth Sprout, MD 04/05/18 1530

## 2018-04-04 NOTE — Discharge Instructions (Signed)
It was my pleasure taking care of you today!  Drink plenty of fluids at home. This will help with your headache.  Please follow up with your primary care doctor. Call tomorrow to schedule a follow up appointment.    Return to ER for worsening chest pain, fever, new neck stiffness, rash, weakness, trouble with your speech, trouble walking, new or worsening symptoms or any concerning symptoms, please return to the ED immediately.

## 2018-06-04 ENCOUNTER — Emergency Department (HOSPITAL_BASED_OUTPATIENT_CLINIC_OR_DEPARTMENT_OTHER): Payer: Self-pay

## 2018-06-04 ENCOUNTER — Emergency Department (HOSPITAL_BASED_OUTPATIENT_CLINIC_OR_DEPARTMENT_OTHER)
Admission: EM | Admit: 2018-06-04 | Discharge: 2018-06-04 | Disposition: A | Discharge status: Home / Self Care | Payer: Self-pay | Attending: Emergency Medicine | Admitting: Emergency Medicine

## 2018-06-04 ENCOUNTER — Encounter (HOSPITAL_BASED_OUTPATIENT_CLINIC_OR_DEPARTMENT_OTHER): Payer: Self-pay

## 2018-06-04 ENCOUNTER — Other Ambulatory Visit: Payer: Self-pay

## 2018-06-04 DIAGNOSIS — I10 Essential (primary) hypertension: Secondary | ICD-10-CM | POA: Insufficient documentation

## 2018-06-04 DIAGNOSIS — Y929 Unspecified place or not applicable: Secondary | ICD-10-CM | POA: Insufficient documentation

## 2018-06-04 DIAGNOSIS — Y998 Other external cause status: Secondary | ICD-10-CM | POA: Insufficient documentation

## 2018-06-04 DIAGNOSIS — J4520 Mild intermittent asthma, uncomplicated: Secondary | ICD-10-CM | POA: Insufficient documentation

## 2018-06-04 DIAGNOSIS — S62501A Fracture of unspecified phalanx of right thumb, initial encounter for closed fracture: Secondary | ICD-10-CM

## 2018-06-04 DIAGNOSIS — W51XXXA Accidental striking against or bumped into by another person, initial encounter: Secondary | ICD-10-CM | POA: Insufficient documentation

## 2018-06-04 DIAGNOSIS — Y9389 Activity, other specified: Secondary | ICD-10-CM | POA: Insufficient documentation

## 2018-06-04 DIAGNOSIS — S63111A Subluxation of metacarpophalangeal joint of right thumb, initial encounter: Secondary | ICD-10-CM | POA: Insufficient documentation

## 2018-06-04 DIAGNOSIS — S62231A Other displaced fracture of base of first metacarpal bone, right hand, initial encounter for closed fracture: Secondary | ICD-10-CM | POA: Insufficient documentation

## 2018-06-04 DIAGNOSIS — Z79899 Other long term (current) drug therapy: Secondary | ICD-10-CM | POA: Insufficient documentation

## 2018-06-04 MED ORDER — HYDROCODONE-ACETAMINOPHEN 5-325 MG PO TABS: 1.0000 | ORAL_TABLET | Freq: Four times a day (QID) | ORAL | 0 refills | Status: DC | PRN

## 2018-06-04 MED ORDER — IBUPROFEN 400 MG PO TABS
600.0000 mg | ORAL_TABLET | Freq: Once | ORAL | Status: AC
  Administered 2018-06-04: 600 mg via ORAL
  Filled 2018-06-04: qty 1

## 2018-06-04 NOTE — ED Provider Notes (Signed)
[facility] [place] EMERGENCY DEPARTMENT Provider Note   CSN: [id number] Arrival date & time: 06/04/18  1241     History   Chief Complaint Chief Complaint  Patient presents with  . Finger Injury    HPI Veronica Braun is a 52 y.o. female.  HPI Veronica Braun is a 52 y.o. female presents to emergency department with complaint of right thumb injury.  Patient states that she was playing with her [id number]-year-old grandson, and states she was catching him as he was jumping on the couch and her right thumb bent backwards.  Reports immediate pain and swelling to the finger.  Did not take any medications prior to coming in.  No treatment.  Patient is right-handed.  No other complaints or injuries.  Past Medical History:  Diagnosis Date  . Asthma   . DDD (degenerative disc disease)   . Hypertension   . Migraine   . Vertigo     Patient Active Problem List   Diagnosis Date Noted  . Mild intermittent asthma without complication 10/13/2017  . Hot flashes 10/13/2017  . Migraine 12/29/2010  . Essential hypertension 12/29/2010    Past Surgical History:  Procedure Laterality Date  . ABDOMINAL HYSTERECTOMY    . APPENDECTOMY    . CHOLECYSTECTOMY       OB History   None      Home Medications    Prior to Admission medications   Medication Sig Start Date End Date Taking? Authorizing Provider  albuterol (PROVENTIL HFA;VENTOLIN HFA) 108 (90 Base) MCG/ACT inhaler Inhale 2 puffs into the lungs every 4 (four) hours as needed for wheezing. 10/13/17 09/06/23  Marcine MatarJohnson, Deborah B, MD  b complex vitamins tablet Take 1 tablet by mouth daily.    [provider]  estradiol (CLIMARA) 0.075 mg/24hr patch Place 1 patch (0.075 mg total) onto the skin once a week. 10/13/17   Marcine MatarJohnson, Deborah B, MD  lisinopril-hydrochlorothiazide (PRINZIDE,ZESTORETIC) 10-12.5 MG tablet Take 1 tablet by mouth daily. 12/25/17   Marcine MatarJohnson, Deborah B, MD  Multiple Vitamin (MULITIVITAMIN WITH MINERALS) TABS Take 1  tablet by mouth daily.    [provider]    Family History Family History  Problem Relation Age of Onset  . Hypertension Mother   . Diabetes Father   . Hypertension Father   . Heart attack Neg Hx     Social History Social History   Tobacco Use  . Smoking status: Never Smoker  . Smokeless tobacco: Never Used  Substance Use Topics  . Alcohol use: Yes    Comment: occasional  . Drug use: No     Allergies   Patient has no known allergies.   Review of Systems Review of Systems  Constitutional: Negative for chills and fever.  Musculoskeletal: Positive for arthralgias and joint swelling.  Neurological: Negative for weakness and numbness.  All other systems reviewed and are negative.    Physical Exam Updated Vital Signs BP 136/81 (BP Location: Left Arm)   Pulse 70   Temp 98.2 F (36.8 C) (Oral)   Resp 18   Ht 5\' 2"  (1.575 m)   Wt 79.4 kg (175 lb)   SpO2 98%   BMI 32.01 kg/m   Physical Exam  Constitutional: She appears well-developed and well-nourished. No distress.  Eyes: Conjunctivae are normal.  Neck: Neck supple.  Musculoskeletal:  No obvious deformity noted to the right thumb, there is tenderness to palpation over right first MCP joint and PIP joint.  Unable to move due to pain at [facility]  joint.  Some pain with movement of the PIP joint.  Tenderness to palpation over thenar muscle as well.  Neurological: She is alert.  Skin: Skin is warm and dry.  Nursing note and vitals reviewed.    ED Treatments / Results  Labs (all labs ordered are listed, but only abnormal results are displayed) Labs Reviewed - No data to display  EKG None  Radiology Dg Finger Thumb Right  Result Date: 06/04/2018 CLINICAL DATA:  Hyperextension injury of the left thumb today when the patient was playing with her grandson. Limited range of motion and pain. Initial encounter. EXAM: RIGHT THUMB 2+V COMPARISON:  None. FINDINGS: The proximal phalanx of the thumb is ulnarly  subluxed approximately 50% off the first metacarpal head. A thin bone fragment measuring 0.3 cm is displaced off the ulnar aspect of the head of the first metacarpal. No other evidence of fracture is seen. IMPRESSION: Ulnar subluxation of the thumb over the first metacarpal head. Bone fragment off the ulnar aspect of the head of the first metacarpal is likely an associated fracture. Electronically Signed   By: Drusilla Kanner M.D.   On: 06/04/2018 13:14    Procedures Procedures (including critical care time)  Medications Ordered in ED Medications  ibuprofen (ADVIL,MOTRIN) tablet 600 mg (has no administration in time range)     Initial Impression / Assessment and Plan / ED Course  I have reviewed the triage vital signs and the nursing notes.  Pertinent labs & imaging results that were available during my care of the patient were reviewed by me and considered in my medical decision making (see chart for details).     Pt with injury to right thumb. Xray showing ulnar sublaxation with associated fracture.  Otherwise neurovascularly intact.  Discussed patient's x-ray with Dr. Izora Ribas who is on call for hand surgery, who advised me to place patient in a Velcro thumb spica and have her follow-up with him in the office.  We discussed his and elevation, as well as ibuprofen or Tylenol for pain and I will give her 10 tablets of Norco for severe pain.  Return precautions discussed.   Vitals:   06/04/18 1248  BP: 136/81  Pulse: 70  Resp: 18  Temp: 98.2 F (36.8 C)  TempSrc: Oral  SpO2: 98%  Weight: 79.4 kg (175 lb)  Height: 5\' 2"  (1.575 m)     Final Clinical Impressions(s) / ED Diagnoses   Final diagnoses:  Closed fracture subluxation of thumb, right, initial encounter    ED Discharge Orders        Ordered    HYDROcodone-acetaminophen (NORCO) 5-325 MG tablet  Every 6 hours PRN     06/04/18 1453       Jaynie Crumble, PA-C 06/04/18 1454    Tilden Fossa, MD 06/05/18  [phone]

## 2018-06-04 NOTE — ED Notes (Signed)
Patient asking about how long until EDP will be in to see them I advised patient the doctor will be in as soon as they are able. Gave patient an ice pack for right hand.

## 2018-06-04 NOTE — Discharge Instructions (Signed)
Keep your finger in a splint at all times other than when you take a shower.  Ice and elevate.  Take ibuprofen or Tylenol for pain.  Take Norco for severe pain only.  Do not drive if taking this medicine.  Follow-up with Dr. Izora Ribasoley

## 2018-06-04 NOTE — ED Triage Notes (Signed)
Pt injured left thumb paying with grandson approx 1 hour PTA-no break in skin/bruising noted-NAD-steady gait

## 2018-09-19 ENCOUNTER — Encounter (HOSPITAL_BASED_OUTPATIENT_CLINIC_OR_DEPARTMENT_OTHER): Payer: Self-pay | Admitting: Emergency Medicine

## 2018-09-19 ENCOUNTER — Emergency Department (HOSPITAL_BASED_OUTPATIENT_CLINIC_OR_DEPARTMENT_OTHER)
Admission: EM | Admit: 2018-09-19 | Discharge: 2018-09-19 | Disposition: A | Discharge status: Home / Self Care | Payer: Self-pay | Attending: Emergency Medicine | Admitting: Emergency Medicine

## 2018-09-19 ENCOUNTER — Other Ambulatory Visit: Payer: Self-pay

## 2018-09-19 DIAGNOSIS — J45909 Unspecified asthma, uncomplicated: Secondary | ICD-10-CM | POA: Insufficient documentation

## 2018-09-19 DIAGNOSIS — Z79899 Other long term (current) drug therapy: Secondary | ICD-10-CM | POA: Insufficient documentation

## 2018-09-19 DIAGNOSIS — I10 Essential (primary) hypertension: Secondary | ICD-10-CM | POA: Insufficient documentation

## 2018-09-19 DIAGNOSIS — J039 Acute tonsillitis, unspecified: Secondary | ICD-10-CM | POA: Insufficient documentation

## 2018-09-19 MED ORDER — CLINDAMYCIN HCL 150 MG PO CAPS
300.0000 mg | ORAL_CAPSULE | Freq: Once | ORAL | Status: AC
  Administered 2018-09-19: 300 mg via ORAL
  Filled 2018-09-19: qty 2

## 2018-09-19 MED ORDER — CLINDAMYCIN HCL 300 MG PO CAPS: 300.0000 mg | ORAL_CAPSULE | Freq: Three times a day (TID) | ORAL | 0 refills | Status: AC

## 2018-09-19 MED ORDER — IBUPROFEN 400 MG PO TABS
600.0000 mg | ORAL_TABLET | Freq: Once | ORAL | Status: AC
  Administered 2018-09-19: 600 mg via ORAL
  Filled 2018-09-19: qty 1

## 2018-09-19 MED ORDER — ACETAMINOPHEN 325 MG PO TABS
650.0000 mg | ORAL_TABLET | Freq: Once | ORAL | Status: AC
  Administered 2018-09-19: 650 mg via ORAL
  Filled 2018-09-19: qty 2

## 2018-09-19 MED FILL — CLINDAMYCIN HCL 300 MG CAP: 300 | 7 days supply | Qty: 21 | Fill #0

## 2018-09-19 NOTE — ED Triage Notes (Signed)
Sore throat and body aches since yesterday.  Vomited x4 yesterday after eating, prior to the start of the sore throat.

## 2018-09-19 NOTE — Discharge Instructions (Addendum)
Take ibuprofen and tylenol

## 2018-09-19 NOTE — ED Provider Notes (Signed)
[facility] EMERGENCY DEPARTMENT Provider Note   CSN: [id number] Arrival date & time: 09/19/18  4098     History   Chief Complaint Chief Complaint  Patient presents with  . Sore Throat    HPI Veronica Braun is a 52 y.o. female.  HPI Patient is a 52 year old female presents with sore throat and body aches since yesterday.  She did vomit yesterday.  She denies nausea vomiting at this time.  She reports painful swallowing.  No difficulty breathing.  Reports mild fullness in both of her ears.  No cough.  No recent sick contacts.  Chills without documented fever.  No other complaints.   Past Medical History:  Diagnosis Date  . Asthma   . DDD (degenerative disc disease)   . Hypertension   . Migraine   . Vertigo     Patient Active Problem List   Diagnosis Date Noted  . Mild intermittent asthma without complication 10/13/2017  . Hot flashes 10/13/2017  . Migraine 12/29/2010  . Essential hypertension 12/29/2010    Past Surgical History:  Procedure Laterality Date  . ABDOMINAL HYSTERECTOMY    . APPENDECTOMY    . CHOLECYSTECTOMY       OB History   None      Home Medications    Prior to Admission medications   Medication Sig Start Date End Date Taking? Authorizing Provider  meclizine (ANTIVERT) 25 MG tablet Take by mouth. 05/14/14  Yes [provider]  albuterol (PROVENTIL HFA;VENTOLIN HFA) 108 (90 Base) MCG/ACT inhaler Inhale 2 puffs into the lungs every 4 (four) hours as needed for wheezing. 10/13/17 09/06/23  Marcine Matar, MD  b complex vitamins tablet Take 1 tablet by mouth daily.    [provider]  clindamycin (CLEOCIN) 300 MG capsule Take 1 capsule (300 mg total) by mouth 3 (three) times daily. 09/19/18   Azalia Bilis, MD  estradiol (CLIMARA) 0.075 mg/24hr patch Place 1 patch (0.075 mg total) onto the skin once a week. 10/13/17   Marcine Matar, MD  lisinopril-hydrochlorothiazide (PRINZIDE,ZESTORETIC) 10-12.5 MG tablet Take 1  tablet by mouth daily. 12/25/17   Marcine Matar, MD  Multiple Vitamin (MULITIVITAMIN WITH MINERALS) TABS Take 1 tablet by mouth daily.    [provider]    Family History Family History  Problem Relation Age of Onset  . Hypertension Mother   . Diabetes Father   . Hypertension Father   . Heart attack Neg Hx     Social History Social History   Tobacco Use  . Smoking status: Never Smoker  . Smokeless tobacco: Never Used  Substance Use Topics  . Alcohol use: Yes    Comment: occasional  . Drug use: No     Allergies   Patient has no known allergies.   Review of Systems Review of Systems  All other systems reviewed and are negative.    Physical Exam Updated Vital Signs BP (!) 148/86 (BP Location: Right Arm)   Pulse 86   Temp 99.3 F (37.4 C) (Oral)   Resp 16   Ht 5' 2.5" (1.588 m)   Wt 77.1 kg   SpO2 99%   BMI 30.60 kg/m   Physical Exam  Constitutional: She is oriented to person, place, and time. She appears well-developed and well-nourished. No distress.  HENT:  Head: Normocephalic and atraumatic.  Bilateral tonsillar swelling with associated erythema right slightly greater than left.  Uvula midline.  No obvious signs of peritonsillar abscess.  Tolerating secretions.  Oral  airway patent.  Dentition grossly normal.  Anterior neck normal.  Eyes: EOM are normal.  Neck: Normal range of motion.  Cardiovascular: Normal rate, regular rhythm and normal heart sounds.  Pulmonary/Chest: Effort normal and breath sounds normal.  Abdominal: Soft. She exhibits no distension. There is no tenderness.  Musculoskeletal: Normal range of motion.  Neurological: She is alert and oriented to person, place, and time.  Skin: Skin is warm and dry.  Psychiatric: She has a normal mood and affect. Judgment normal.  Nursing note and vitals reviewed.    ED Treatments / Results  Labs (all labs ordered are listed, but only abnormal results are displayed) Labs Reviewed - No  data to display  EKG None  Radiology No results found.  Procedures Procedures (including critical care time)  Medications Ordered in ED Medications  ibuprofen (ADVIL,MOTRIN) tablet 600 mg (has no administration in time range)  acetaminophen (TYLENOL) tablet 650 mg (has no administration in time range)  clindamycin (CLEOCIN) capsule 300 mg (has no administration in time range)     Initial Impression / Assessment and Plan / ED Course  I have reviewed the triage vital signs and the nursing notes.  Pertinent labs & imaging results that were available during my care of the patient were reviewed by me and considered in my medical decision making (see chart for details).     Suspect acute tonsillitis.  No signs to suggest peritonsillar abscess.  Doubt retropharyngeal abscess.  Tolerating secretions.  Home with antibiotics.  Instructed to return to the emergency department for new or worsening symptoms.  Stable for discharge  Final Clinical Impressions(s) / ED Diagnoses   Final diagnoses:  Acute tonsillitis, unspecified etiology    ED Discharge Orders         Ordered    clindamycin (CLEOCIN) 300 MG capsule  3 times daily     09/19/18 1610           Azalia Bilis, MD 09/19/18 [phone]

## 2018-10-16 ENCOUNTER — Other Ambulatory Visit: Payer: Self-pay | Admitting: Internal Medicine

## 2018-10-16 DIAGNOSIS — R232 Flushing: Secondary | ICD-10-CM

## 2018-11-11 ENCOUNTER — Telehealth (HOSPITAL_BASED_OUTPATIENT_CLINIC_OR_DEPARTMENT_OTHER): Payer: Self-pay | Admitting: Emergency Medicine

## 2018-11-11 ENCOUNTER — Other Ambulatory Visit: Payer: Self-pay

## 2018-11-11 ENCOUNTER — Emergency Department (HOSPITAL_BASED_OUTPATIENT_CLINIC_OR_DEPARTMENT_OTHER)
Admission: EM | Admit: 2018-11-11 | Discharge: 2018-11-11 | Disposition: A | Discharge status: Home / Self Care | Payer: Self-pay | Attending: Emergency Medicine | Admitting: Emergency Medicine

## 2018-11-11 ENCOUNTER — Encounter (HOSPITAL_BASED_OUTPATIENT_CLINIC_OR_DEPARTMENT_OTHER): Payer: Self-pay | Admitting: Emergency Medicine

## 2018-11-11 DIAGNOSIS — I10 Essential (primary) hypertension: Secondary | ICD-10-CM | POA: Insufficient documentation

## 2018-11-11 DIAGNOSIS — Z79899 Other long term (current) drug therapy: Secondary | ICD-10-CM | POA: Insufficient documentation

## 2018-11-11 DIAGNOSIS — J4520 Mild intermittent asthma, uncomplicated: Secondary | ICD-10-CM | POA: Insufficient documentation

## 2018-11-11 DIAGNOSIS — H9202 Otalgia, left ear: Secondary | ICD-10-CM | POA: Insufficient documentation

## 2018-11-11 DIAGNOSIS — J029 Acute pharyngitis, unspecified: Secondary | ICD-10-CM | POA: Insufficient documentation

## 2018-11-11 MED ORDER — IBUPROFEN 800 MG PO TABS: 800.0000 mg | ORAL_TABLET | Freq: Four times a day (QID) | ORAL | 0 refills | Status: AC | PRN

## 2018-11-11 MED ORDER — PREDNISONE 50 MG PO TABS
60.0000 mg | ORAL_TABLET | Freq: Once | ORAL | Status: AC
  Administered 2018-11-11: 60 mg via ORAL
  Filled 2018-11-11: qty 1

## 2018-11-11 MED ORDER — AMOXICILLIN 500 MG PO CAPS
500.0000 mg | ORAL_CAPSULE | Freq: Once | ORAL | Status: AC
  Administered 2018-11-11: 500 mg via ORAL
  Filled 2018-11-11: qty 1

## 2018-11-11 MED ORDER — AMOXICILLIN 500 MG PO CAPS: 500.0000 mg | ORAL_CAPSULE | Freq: Three times a day (TID) | ORAL | 0 refills | Status: AC

## 2018-11-11 NOTE — ED Triage Notes (Signed)
L ear pain that started ~2200 last night. No other sx. Cold sx that resolved 2 wks ago. States it hurts her ear to swallow. Denies fevers.

## 2018-11-11 NOTE — ED Provider Notes (Signed)
[facility] EMERGENCY DEPARTMENT Provider Note   CSN: [id number] Arrival date & time: 11/11/18  4098     History   Chief Complaint Chief Complaint  Patient presents with  . Otalgia    HPI Veronica Braun is a 52 y.o. female.  Patient reports left ear pain that began last night around 10 PM.  She has had persistent pain in the ear that worsens when she swallows.  The area just in front of and below the ear is tender to the touch.  No hearing loss.  She had a cold a couple of weeks ago but those symptoms have resolved.     Past Medical History:  Diagnosis Date  . Asthma   . DDD (degenerative disc disease)   . Hypertension   . Migraine   . Vertigo     Patient Active Problem List   Diagnosis Date Noted  . Mild intermittent asthma without complication 10/13/2017  . Hot flashes 10/13/2017  . Migraine 12/29/2010  . Essential hypertension 12/29/2010    Past Surgical History:  Procedure Laterality Date  . ABDOMINAL HYSTERECTOMY    . APPENDECTOMY    . CHOLECYSTECTOMY       OB History   No obstetric history on file.      Home Medications    Prior to Admission medications   Medication Sig Start Date End Date Taking? Authorizing Provider  albuterol (PROVENTIL HFA;VENTOLIN HFA) 108 (90 Base) MCG/ACT inhaler Inhale 2 puffs into the lungs every 4 (four) hours as needed for wheezing. 10/13/17 09/06/23  Marcine Matar, MD  amoxicillin (AMOXIL) 500 MG capsule Take 1 capsule (500 mg total) by mouth 3 (three) times daily. 11/11/18   Gilda Crease, MD  b complex vitamins tablet Take 1 tablet by mouth daily.    [provider]  clindamycin (CLEOCIN) 300 MG capsule Take 1 capsule (300 mg total) by mouth 3 (three) times daily. 09/19/18   Azalia Bilis, MD  estradiol (CLIMARA) 0.075 mg/24hr patch Place 1 patch (0.075 mg total) onto the skin once a week. 10/13/17   Marcine Matar, MD  ibuprofen (ADVIL,MOTRIN) 800 MG tablet Take 1 tablet (800 mg  total) by mouth every 6 (six) hours as needed for moderate pain. 11/11/18   Gilda Crease, MD  lisinopril-hydrochlorothiazide (PRINZIDE,ZESTORETIC) 10-12.5 MG tablet Take 1 tablet by mouth daily. 12/25/17   Marcine Matar, MD  meclizine (ANTIVERT) 25 MG tablet Take by mouth. 05/14/14   [provider]  Multiple Vitamin (MULITIVITAMIN WITH MINERALS) TABS Take 1 tablet by mouth daily.    [provider]    Family History Family History  Problem Relation Age of Onset  . Hypertension Mother   . Diabetes Father   . Hypertension Father   . Heart attack Neg Hx     Social History Social History   Tobacco Use  . Smoking status: Never Smoker  . Smokeless tobacco: Never Used  Substance Use Topics  . Alcohol use: Yes    Comment: occasional  . Drug use: No     Allergies   Patient has no known allergies.   Review of Systems Review of Systems  HENT: Positive for ear pain.   All other systems reviewed and are negative.    Physical Exam Updated Vital Signs BP (!) 170/93   Pulse 80   Temp 98.1 F (36.7 C)   Resp 20   Ht 5\' 2"  (1.575 m)   Wt 79.4 kg   SpO2 97%  BMI 32.01 kg/m   Physical Exam Vitals signs and nursing note reviewed.  Constitutional:      General: She is not in acute distress.    Appearance: Normal appearance. She is well-developed.  HENT:     Head: Normocephalic and atraumatic.     Right Ear: Hearing normal.     Left Ear: Hearing normal.     Nose: Nose normal.     Mouth/Throat:     Pharynx: Posterior oropharyngeal erythema and uvula swelling present. No oropharyngeal exudate.     Tonsils: No tonsillar exudate or tonsillar abscesses.  Eyes:     Conjunctiva/sclera: Conjunctivae normal.     Pupils: Pupils are equal, round, and reactive to light.  Neck:     Musculoskeletal: Normal range of motion and neck supple.  Cardiovascular:     Rate and Rhythm: Regular rhythm.     Heart sounds: S1 normal and S2 normal. No murmur. No  friction rub. No gallop.   Pulmonary:     Effort: Pulmonary effort is normal. No respiratory distress.     Breath sounds: Normal breath sounds.  Chest:     Chest wall: No tenderness.  Abdominal:     General: Bowel sounds are normal.     Palpations: Abdomen is soft.     Tenderness: There is no abdominal tenderness. There is no guarding or rebound. Negative signs include Murphy's sign and McBurney's sign.     Hernia: No hernia is present.  Musculoskeletal: Normal range of motion.  Lymphadenopathy:     Cervical: Cervical adenopathy present.     Right cervical: Superficial cervical adenopathy present.     Left cervical: Superficial cervical adenopathy present.  Skin:    General: Skin is warm and dry.     Findings: No rash.  Neurological:     Mental Status: She is alert and oriented to person, place, and time.     GCS: GCS eye subscore is 4. GCS verbal subscore is 5. GCS motor subscore is 6.     Cranial Nerves: No cranial nerve deficit.     Sensory: No sensory deficit.     Coordination: Coordination normal.  Psychiatric:        Speech: Speech normal.        Behavior: Behavior normal.        Thought Content: Thought content normal.      ED Treatments / Results  Labs (all labs ordered are listed, but only abnormal results are displayed) Labs Reviewed - No data to display  EKG None  Radiology No results found.  Procedures Procedures (including critical care time)  Medications Ordered in ED Medications  predniSONE (DELTASONE) tablet 60 mg (has no administration in time range)  amoxicillin (AMOXIL) capsule 500 mg (has no administration in time range)     Initial Impression / Assessment and Plan / ED Course  I have reviewed the triage vital signs and the nursing notes.  Pertinent labs & imaging results that were available during my care of the patient were reviewed by me and considered in my medical decision making (see chart for details).     Patient presents to the  emergency department for evaluation of left ear pain.  Tympanic membrane examination reveals no evidence of infection or inflammation.  External canals normal.  Patient reports severe pain when she swallows.  Oral pharyngeal examination reveals erythema and swelling, no exudate.  Ear pain is likely referred from the sore throat.  She does not have any other cold  symptoms, does have some lymphadenopathy.  Will treat with analgesia, amoxicillin.  Final Clinical Impressions(s) / ED Diagnoses   Final diagnoses:  Left ear pain  Pharyngitis, unspecified etiology    ED Discharge Orders         Ordered    amoxicillin (AMOXIL) 500 MG capsule  3 times daily     11/11/18 0703    ibuprofen (ADVIL,MOTRIN) 800 MG tablet  Every 6 hours PRN     11/11/18 0703           Gilda CreasePollina,  J, MD 11/11/18 [phone]

## 2019-12-12 ENCOUNTER — Other Ambulatory Visit: Payer: Self-pay | Admitting: Orthopedic Surgery

## 2019-12-12 DIAGNOSIS — M259 Joint disorder, unspecified: Secondary | ICD-10-CM

## 2019-12-18 ENCOUNTER — Ambulatory Visit
Admission: RE | Admit: 2019-12-18 | Discharge: 2019-12-18 | Disposition: A | Discharge status: Home / Self Care | Payer: BLUE CROSS/BLUE SHIELD | Source: Ambulatory Visit | Attending: Orthopedic Surgery | Admitting: Orthopedic Surgery

## 2019-12-18 ENCOUNTER — Other Ambulatory Visit: Payer: Self-pay

## 2019-12-18 DIAGNOSIS — M259 Joint disorder, unspecified: Secondary | ICD-10-CM

## 2019-12-30 ENCOUNTER — Other Ambulatory Visit: Payer: Self-pay | Admitting: Orthopedic Surgery

## 2019-12-30 DIAGNOSIS — M259 Joint disorder, unspecified: Secondary | ICD-10-CM

## 2020-01-01 ENCOUNTER — Ambulatory Visit
Admission: RE | Admit: 2020-01-01 | Discharge: 2020-01-01 | Disposition: A | Discharge status: Home / Self Care | Payer: BLUE CROSS/BLUE SHIELD | Source: Ambulatory Visit | Attending: Orthopedic Surgery | Admitting: Orthopedic Surgery

## 2020-01-01 ENCOUNTER — Other Ambulatory Visit: Payer: Self-pay

## 2020-01-01 ENCOUNTER — Other Ambulatory Visit: Payer: BLUE CROSS/BLUE SHIELD

## 2020-01-01 DIAGNOSIS — M259 Joint disorder, unspecified: Secondary | ICD-10-CM

## 2020-01-01 MED ORDER — IOPAMIDOL (ISOVUE-M 200) INJECTION 41%
1.0000 mL | Freq: Once | INTRAMUSCULAR | Status: AC
  Administered 2020-01-01: 09:00:00 1 mL via INTRA_ARTICULAR

## 2020-01-01 MED ORDER — METHYLPREDNISOLONE ACETATE 40 MG/ML INJ SUSP (RADIOLOG
120.0000 mg | Freq: Once | INTRAMUSCULAR | Status: AC
  Administered 2020-01-01: 09:00:00 120 mg via INTRA_ARTICULAR

## 2020-09-19 ENCOUNTER — Emergency Department (HOSPITAL_BASED_OUTPATIENT_CLINIC_OR_DEPARTMENT_OTHER)
Admission: EM | Admit: 2020-09-19 | Discharge: 2020-09-19 | Disposition: A | Discharge status: Home / Self Care | Payer: BLUE CROSS/BLUE SHIELD | Attending: Emergency Medicine | Admitting: Emergency Medicine

## 2020-09-19 ENCOUNTER — Encounter (HOSPITAL_BASED_OUTPATIENT_CLINIC_OR_DEPARTMENT_OTHER): Payer: Self-pay | Admitting: Emergency Medicine

## 2020-09-19 ENCOUNTER — Emergency Department (HOSPITAL_BASED_OUTPATIENT_CLINIC_OR_DEPARTMENT_OTHER): Payer: BLUE CROSS/BLUE SHIELD

## 2020-09-19 ENCOUNTER — Other Ambulatory Visit: Payer: Self-pay

## 2020-09-19 DIAGNOSIS — I10 Essential (primary) hypertension: Secondary | ICD-10-CM | POA: Diagnosis not present

## 2020-09-19 DIAGNOSIS — Z79899 Other long term (current) drug therapy: Secondary | ICD-10-CM | POA: Insufficient documentation

## 2020-09-19 DIAGNOSIS — W500XXA Accidental hit or strike by another person, initial encounter: Secondary | ICD-10-CM | POA: Diagnosis not present

## 2020-09-19 DIAGNOSIS — J4520 Mild intermittent asthma, uncomplicated: Secondary | ICD-10-CM | POA: Diagnosis not present

## 2020-09-19 DIAGNOSIS — Y9389 Activity, other specified: Secondary | ICD-10-CM | POA: Insufficient documentation

## 2020-09-19 DIAGNOSIS — S8991XA Unspecified injury of right lower leg, initial encounter: Secondary | ICD-10-CM | POA: Diagnosis present

## 2020-09-19 DIAGNOSIS — S86911A Strain of unspecified muscle(s) and tendon(s) at lower leg level, right leg, initial encounter: Secondary | ICD-10-CM | POA: Insufficient documentation

## 2020-09-19 DIAGNOSIS — Y92019 Unspecified place in single-family (private) house as the place of occurrence of the external cause: Secondary | ICD-10-CM | POA: Diagnosis not present

## 2020-09-19 MED ORDER — NAPROXEN 250 MG PO TABS
500.0000 mg | ORAL_TABLET | Freq: Once | ORAL | Status: AC
  Administered 2020-09-19: 500 mg via ORAL
  Filled 2020-09-19: qty 2
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
## 2020-09-19 NOTE — Discharge Instructions (Signed)
Take Tylenol or Motrin recommend weightbearing as tolerated.  Recommend rest and ice for today.
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
## 2020-11-17 ENCOUNTER — Other Ambulatory Visit: Payer: Self-pay

## 2020-11-17 ENCOUNTER — Emergency Department (HOSPITAL_BASED_OUTPATIENT_CLINIC_OR_DEPARTMENT_OTHER)
Admission: EM | Admit: 2020-11-17 | Discharge: 2020-11-17 | Disposition: A | Discharge status: Home / Self Care | Payer: BLUE CROSS/BLUE SHIELD | Attending: Emergency Medicine | Admitting: Emergency Medicine

## 2020-11-17 ENCOUNTER — Encounter (HOSPITAL_BASED_OUTPATIENT_CLINIC_OR_DEPARTMENT_OTHER): Payer: Self-pay | Admitting: Emergency Medicine

## 2020-11-17 DIAGNOSIS — J4520 Mild intermittent asthma, uncomplicated: Secondary | ICD-10-CM | POA: Insufficient documentation

## 2020-11-17 DIAGNOSIS — Z79899 Other long term (current) drug therapy: Secondary | ICD-10-CM | POA: Diagnosis not present

## 2020-11-17 DIAGNOSIS — I10 Essential (primary) hypertension: Secondary | ICD-10-CM | POA: Diagnosis not present

## 2020-11-17 DIAGNOSIS — J069 Acute upper respiratory infection, unspecified: Secondary | ICD-10-CM

## 2020-11-17 DIAGNOSIS — U071 COVID-19: Secondary | ICD-10-CM | POA: Diagnosis not present

## 2020-11-17 DIAGNOSIS — R059 Cough, unspecified: Secondary | ICD-10-CM | POA: Diagnosis present

## 2020-11-17 LAB — SARS CORONAVIRUS 2 (TAT 6-24 HRS): SARS Coronavirus 2: POSITIVE — AB

## 2020-11-17 MED ORDER — BENZONATATE 100 MG PO CAPS: 100.0000 mg | ORAL_CAPSULE | Freq: Three times a day (TID) | ORAL | 0 refills | Status: AC

## 2020-11-17 MED ORDER — BENZONATATE 100 MG PO CAPS
100.0000 mg | ORAL_CAPSULE | Freq: Three times a day (TID) | ORAL | 0 refills | Status: DC
Start: 2020-11-17 — End: 2020-11-17

## 2020-11-17 NOTE — Discharge Instructions (Addendum)
The Covid test is pending at time of discharge.  Instructions on how to follow this up on my chart are on your discharge paperwork, you can also call the department if you are having trouble finding these results.  If he/she is Covid positive he/she will need to be quarantine for total 10 days since the onset of symptoms +24 hours of no symptoms. if he/she is not Covid positive he/she is able to go back to normal day-to-day routine as long as he/she is not having fevers and it has been 24 hours since his/her last fever. ° °You can take 600 mg of ibuprofen every 6 hours, you can take 1000 mg of Tylenol every 6 hours, you can alternate these every 3 or you can take them together. ° °

## 2020-11-17 NOTE — ED Triage Notes (Signed)
Pt endorses sore throat, cough, congestion and fatigue x 1 week. Also reports decreased appetite. Pt is not vaccinated for COVID.

## 2020-11-17 NOTE — ED Provider Notes (Signed)
[facility] EMERGENCY DEPARTMENT Provider Note   CSN: [id number] Arrival date & time: 11/17/20  0175     History Chief Complaint  Patient presents with  . Cough    Veronica Braun is a 55 y.o. female.   Cough Cough characteristics:  Non-productive Severity:  Moderate Onset quality:  Gradual Duration:  7 days Timing:  Constant Progression:  Waxing and waning Chronicity:  New Context: upper respiratory infection   Relieved by:  Nothing Worsened by:  Nothing Ineffective treatments:  None tried Associated symptoms: myalgias, rhinorrhea and sinus congestion   Associated symptoms: no chest pain, no chills, no fever, no headaches, no rash and no shortness of breath        Past Medical History:  Diagnosis Date  . Asthma   . DDD (degenerative disc disease)   . Hypertension   . Migraine   . Vertigo     Patient Active Problem List   Diagnosis Date Noted  . Mild intermittent asthma without complication 09/07/8526  . Hot flashes 10/13/2017  . Migraine 12/29/2010  . Essential hypertension 12/29/2010    Past Surgical History:  Procedure Laterality Date  . ABDOMINAL HYSTERECTOMY    . APPENDECTOMY    . CHOLECYSTECTOMY       OB History   No obstetric history on file.     Family History  Problem Relation Age of Onset  . Hypertension Mother   . Diabetes Father   . Hypertension Father   . Heart attack Neg Hx     Social History   Tobacco Use  . Smoking status: Never Smoker  . Smokeless tobacco: Never Used  Vaping Use  . Vaping Use: Never used  Substance Use Topics  . Alcohol use: Yes    Comment: occasional  . Drug use: No    Home Medications Prior to Admission medications   Medication Sig Start Date End Date Taking? Authorizing Provider  benzonatate (TESSALON) 100 MG capsule Take 1 capsule (100 mg total) by mouth every 8 (eight) hours. 11/17/20  Yes Breck Coons, MD  albuterol (PROVENTIL HFA;VENTOLIN HFA) 108 (90 Base) MCG/ACT inhaler Inhale 2  puffs into the lungs every 4 (four) hours as needed for wheezing. 10/13/17 09/06/23  Ladell Pier, MD  amoxicillin (AMOXIL) 500 MG capsule Take 1 capsule (500 mg total) by mouth 3 (three) times daily. 11/11/18   Orpah Greek, MD  b complex vitamins tablet Take 1 tablet by mouth daily.    [provider]  clindamycin (CLEOCIN) 300 MG capsule Take 1 capsule (300 mg total) by mouth 3 (three) times daily. 09/19/18   Jola Schmidt, MD  estradiol (CLIMARA) 0.075 mg/24hr patch Place 1 patch (0.075 mg total) onto the skin once a week. 10/13/17   Ladell Pier, MD  ibuprofen (ADVIL,MOTRIN) 800 MG tablet Take 1 tablet (800 mg total) by mouth every 6 (six) hours as needed for moderate pain. 11/11/18   Orpah Greek, MD  lisinopril-hydrochlorothiazide (PRINZIDE,ZESTORETIC) 10-12.5 MG tablet Take 1 tablet by mouth daily. 12/25/17   Ladell Pier, MD  meclizine (ANTIVERT) 25 MG tablet Take by mouth. 05/14/14   [provider]  Multiple Vitamin (MULITIVITAMIN WITH MINERALS) TABS Take 1 tablet by mouth daily.    [provider]    Allergies    Patient has no known allergies.  Review of Systems   Review of Systems  Constitutional: Negative for chills and fever.  HENT: Positive for congestion and rhinorrhea.   Respiratory: Positive for  cough. Negative for shortness of breath.   Cardiovascular: Negative for chest pain and palpitations.  Gastrointestinal: Negative for diarrhea, nausea and vomiting.  Genitourinary: Negative for difficulty urinating and dysuria.  Musculoskeletal: Positive for myalgias. Negative for arthralgias and back pain.  Skin: Negative for rash and wound.  Neurological: Negative for light-headedness and headaches.    Physical Exam Updated Vital Signs BP (!) 143/92   Pulse 72   Temp 97.9 F (36.6 C) (Oral)   Resp 18   Ht 5\' 2"  (1.575 m)   Wt 80.3 kg   SpO2 98%   BMI 32.37 kg/m   Physical Exam Vitals reviewed. Exam  conducted with a chaperone present.  Constitutional:      General: She is not in acute distress.    Appearance: Normal appearance.  HENT:     Head: Normocephalic and atraumatic.     Nose: No rhinorrhea.     Mouth/Throat:     Mouth: Mucous membranes are moist.     Pharynx: Oropharynx is clear.  Eyes:     General:        Right eye: No discharge.        Left eye: No discharge.     Conjunctiva/sclera: Conjunctivae normal.  Cardiovascular:     Rate and Rhythm: Normal rate and regular rhythm.  Pulmonary:     Effort: Pulmonary effort is normal. No respiratory distress.     Breath sounds: No stridor. No wheezing or rales.  Abdominal:     General: Abdomen is flat. There is no distension.     Palpations: Abdomen is soft.  Musculoskeletal:        General: No tenderness or signs of injury.  Skin:    General: Skin is warm and dry.     Capillary Refill: Capillary refill takes less than 2 seconds.  Neurological:     General: No focal deficit present.     Mental Status: She is alert. Mental status is at baseline.     Motor: No weakness.  Psychiatric:        Mood and Affect: Mood normal.        Behavior: Behavior normal.     ED Results / Procedures / Treatments   Labs (all labs ordered are listed, but only abnormal results are displayed) Labs Reviewed  SARS CORONAVIRUS 2 (TAT 6-24 HRS)    EKG None  Radiology No results found.  Procedures Procedures (including critical care time)  Medications Ordered in ED Medications - No data to display  ED Course  I have reviewed the triage vital signs and the nursing notes.  Pertinent labs & imaging results that were available during my care of the patient were reviewed by me and considered in my medical decision making (see chart for details).    MDM Rules/Calculators/A&P                          uRI consistent symptoms, well-appearing normal work of breathing well-hydrated.  Clear lungs.  Normal vital signs.  Covid will be  tested.  Patient is not vaccinated.  Pending at time of discharge return precautions Courtney precautions given.  Tessalon Perles given for cough.  Strict return precautions provided. Final Clinical Impression(s) / ED Diagnoses Final diagnoses:  Viral URI with cough    Rx / DC Orders ED Discharge Orders         Ordered    benzonatate (TESSALON) 100 MG capsule  Every 8 hours  11/17/20 1036           Sabino Donovan, MD 11/17/20 1039

## 2020-11-17 NOTE — ED Notes (Signed)
Patient alert and oriented, respirations regular and unlabored.  D/c instructions reviewed, patient verbalized understanding

## 2021-06-26 ENCOUNTER — Emergency Department (HOSPITAL_BASED_OUTPATIENT_CLINIC_OR_DEPARTMENT_OTHER): Payer: BLUE CROSS/BLUE SHIELD

## 2021-06-26 ENCOUNTER — Encounter (HOSPITAL_BASED_OUTPATIENT_CLINIC_OR_DEPARTMENT_OTHER): Payer: Self-pay | Admitting: Emergency Medicine

## 2021-06-26 ENCOUNTER — Emergency Department (HOSPITAL_BASED_OUTPATIENT_CLINIC_OR_DEPARTMENT_OTHER)
Admission: EM | Admit: 2021-06-26 | Discharge: 2021-06-26 | Disposition: A | Discharge status: Home / Self Care | Payer: BLUE CROSS/BLUE SHIELD | Attending: Emergency Medicine | Admitting: Emergency Medicine

## 2021-06-26 ENCOUNTER — Other Ambulatory Visit: Payer: Self-pay

## 2021-06-26 DIAGNOSIS — J4520 Mild intermittent asthma, uncomplicated: Secondary | ICD-10-CM | POA: Diagnosis not present

## 2021-06-26 DIAGNOSIS — R2242 Localized swelling, mass and lump, left lower limb: Secondary | ICD-10-CM | POA: Insufficient documentation

## 2021-06-26 DIAGNOSIS — I10 Essential (primary) hypertension: Secondary | ICD-10-CM | POA: Diagnosis not present

## 2021-06-26 DIAGNOSIS — Z79899 Other long term (current) drug therapy: Secondary | ICD-10-CM | POA: Insufficient documentation

## 2021-06-26 DIAGNOSIS — M25562 Pain in left knee: Secondary | ICD-10-CM

## 2021-06-26 DIAGNOSIS — M7122 Synovial cyst of popliteal space [Baker], left knee: Secondary | ICD-10-CM | POA: Diagnosis not present

## 2021-06-26 MED ORDER — NAPROXEN 250 MG PO TABS: 500.0000 mg | ORAL_TABLET | Freq: Two times a day (BID) | ORAL | 0 refills | Status: AC

## 2021-06-26 MED ORDER — METHOCARBAMOL 500 MG PO TABS
500.0000 mg | ORAL_TABLET | Freq: Once | ORAL | Status: AC
  Administered 2021-06-26: 500 mg via ORAL
  Filled 2021-06-26: qty 1

## 2021-06-26 MED ORDER — KETOROLAC TROMETHAMINE 15 MG/ML IJ SOLN
15.0000 mg | Freq: Once | INTRAMUSCULAR | Status: AC
  Administered 2021-06-26: 15 mg via INTRAMUSCULAR
  Filled 2021-06-26: qty 1

## 2021-06-26 NOTE — Discharge Instructions (Addendum)
Take naproxen 2 times a day with meals.  Do not take other anti-inflammatories at the same time (Advil, Motrin, ibuprofen, Aleve). You may supplement with Tylenol if you need further pain control. Use ice packs 3 times a day for 20 minutes to help with pain and swelling. Use the knee sleeve as needed for support and compression. Follow-up with your orthopedic doctor for further evaluation of your leg. Return to emergency room with any new, worsening, concerning symptoms

## 2021-06-26 NOTE — ED Provider Notes (Signed)
[facility] EMERGENCY DEPARTMENT Provider Note   CSN: [id number] Arrival date & time: 06/26/21  1034     History Chief Complaint  Patient presents with   Leg Pain    Gary Bultman is a 55 y.o. female presenting for evaluation of L knee pain.   Pt states for the past 2 days, she has had a lot of left knee and leg pain.  She reports no precipitating events including trauma or injury.  Pain is mostly behind her knee and extends into her calf and in the back of her left upper leg.  She does have a history of back problems, but reports no new back pain.  She has tried Tylenol without improvement of symptoms.  Pain is constant, nothing makes it better.  No numbness or tingling.  No pain on the right side.  She is on an estrogen patch, otherwise no PE/dvt risk factors.   HPI     Past Medical History:  Diagnosis Date   Asthma    DDD (degenerative disc disease)    Hypertension    Migraine    Vertigo     Patient Active Problem List   Diagnosis Date Noted   Mild intermittent asthma without complication 10/13/2017   Hot flashes 10/13/2017   Migraine 12/29/2010   Essential hypertension 12/29/2010    Past Surgical History:  Procedure Laterality Date   ABDOMINAL HYSTERECTOMY     APPENDECTOMY     CHOLECYSTECTOMY       OB History   No obstetric history on file.     Family History  Problem Relation Age of Onset   Hypertension Mother    Diabetes Father    Hypertension Father    Heart attack Neg Hx     Social History   Tobacco Use   Smoking status: Never   Smokeless tobacco: Never  Vaping Use   Vaping Use: Never used  Substance Use Topics   Alcohol use: Yes    Comment: occasional   Drug use: No    Home Medications Prior to Admission medications   Medication Sig Start Date End Date Taking? Authorizing Provider  naproxen (NAPROSYN) 250 MG tablet Take 2 tablets (500 mg total) by mouth 2 (two) times daily with a meal for 7 days. 06/26/21 07/03/21 Yes  Tiffay Pinette, PA-C  albuterol (PROVENTIL HFA;VENTOLIN HFA) 108 (90 Base) MCG/ACT inhaler Inhale 2 puffs into the lungs every 4 (four) hours as needed for wheezing. 10/13/17 09/06/23  Marcine Matar, MD  amoxicillin (AMOXIL) 500 MG capsule Take 1 capsule (500 mg total) by mouth 3 (three) times daily. 11/11/18   Gilda Crease, MD  b complex vitamins tablet Take 1 tablet by mouth daily.    [provider]  benzonatate (TESSALON) 100 MG capsule Take 1 capsule (100 mg total) by mouth every 8 (eight) hours. 11/17/20   Sabino Donovan, MD  clindamycin (CLEOCIN) 300 MG capsule Take 1 capsule (300 mg total) by mouth 3 (three) times daily. 09/19/18   Azalia Bilis, MD  estradiol (CLIMARA) 0.075 mg/24hr patch Place 1 patch (0.075 mg total) onto the skin once a week. 10/13/17   Marcine Matar, MD  ibuprofen (ADVIL,MOTRIN) 800 MG tablet Take 1 tablet (800 mg total) by mouth every 6 (six) hours as needed for moderate pain. 11/11/18   Gilda Crease, MD  lisinopril-hydrochlorothiazide (PRINZIDE,ZESTORETIC) 10-12.5 MG tablet Take 1 tablet by mouth daily. 12/25/17   Marcine Matar, MD  meclizine (ANTIVERT) 25 MG tablet  Take by mouth. 05/14/14   [provider]  Multiple Vitamin (MULITIVITAMIN WITH MINERALS) TABS Take 1 tablet by mouth daily.    [provider]    Allergies    Patient has no known allergies.  Review of Systems   Review of Systems  Musculoskeletal:  Positive for arthralgias, joint swelling and myalgias.  Neurological:  Negative for numbness.  Hematological:  Does not bruise/bleed easily.   Physical Exam Updated Vital Signs BP 127/83 (BP Location: Right Arm)   Pulse 80   Temp 98.8 F (37.1 C) (Oral)   Resp 18   Ht 5\' 2"  (1.575 m)   Wt 82.6 kg   SpO2 100%   BMI 33.29 kg/m   Physical Exam Vitals and nursing note reviewed.  Constitutional:      General: She is not in acute distress.    Appearance: She is well-developed.  HENT:      Head: Normocephalic and atraumatic.  Eyes:     Extraocular Movements: Extraocular movements intact.  Cardiovascular:     Rate and Rhythm: Normal rate.  Pulmonary:     Effort: Pulmonary effort is normal.  Abdominal:     General: There is no distension.  Musculoskeletal:        General: Swelling and tenderness present.     Cervical back: Normal range of motion.     Comments: Mild swelling of the left knee when compared to the right.  No erythema or warmth.  Full active range of motion with pain of the left knee.  Tenderness palpation of the left calf with a positive Homans' sign.  Patient also with a positive straight leg raise.  Pain does not extend into the buttock.  No tenderness palpation of the low back.  Skin:    General: Skin is warm.     Findings: No rash.  Neurological:     Mental Status: She is alert and oriented to person, place, and time.    ED Results / Procedures / Treatments   Labs (all labs ordered are listed, but only abnormal results are displayed) Labs Reviewed - No data to display  EKG None  Radiology Venous Img Lower  Left (DVT Study)  Result Date: 06/26/2021 CLINICAL DATA:  Posterior knee swelling x2 days, pain EXAM: LEFT LOWER EXTREMITY VENOUS DOPPLER ULTRASOUND TECHNIQUE: Gray-scale sonography with compression, as well as color and duplex ultrasound, were performed to evaluate the deep venous system(s) from the level of the common femoral vein through the popliteal and proximal calf veins. COMPARISON:  None. FINDINGS: VENOUS Normal compressibility of the common femoral, superficial femoral, and popliteal veins, as well as the visualized calf veins. Visualized portions of profunda femoral vein and great saphenous vein unremarkable. No filling defects to suggest DVT on grayscale or color Doppler imaging. Doppler waveforms show normal direction of venous flow, normal respiratory phasicity and response to augmentation. Limited views of the contralateral common  femoral vein are unremarkable. OTHER There is a small 1.7 x 0.9 x 1 cm fluid collection in the posterior popliteal fossa., region of symptomatology Limitations: none IMPRESSION: 1. Negative for left lower extremity DVT. 2. Small left Baker's cyst. Electronically Signed   By: 06/28/2021 M.D.   On: 06/26/2021 12:24   DG Knee Complete 4 Views Left  Result Date: 06/26/2021 CLINICAL DATA:  Left posterior knee pain and swelling. No known injury. EXAM: LEFT KNEE - COMPLETE 4+ VIEW COMPARISON:  08/03/2014 FINDINGS: No evidence of fracture or malalignment. No large knee joint  effusion. No evidence of arthropathy or other focal bone abnormality. Soft tissues are unremarkable. IMPRESSION: Negative. Electronically Signed   By: Duanne Guess D.O.   On: 06/26/2021 12:55    Procedures Procedures   Medications Ordered in ED Medications  ketorolac (TORADOL) 15 MG/ML injection 15 mg (has no administration in time range)  methocarbamol (ROBAXIN) tablet 500 mg (has no administration in time range)    ED Course  I have reviewed the triage vital signs and the nursing notes.  Pertinent labs & imaging results that were available during my care of the patient were reviewed by me and considered in my medical decision making (see chart for details).    MDM Rules/Calculators/A&P                           Patient presented for evaluation of left leg pain, mostly behind the left knee.  On exam, patient appears nontoxic.  She is neurovascularly intact.  Consider DVT as patient has a positive Homans and pain of the posterior aspect of 1 leg.  However also consider sciatica/pain originating from the back as patient has a positive straight leg raise and history of back problems.  Will obtain ultrasound and x-ray to ensure no acute abnormalities.  If negative, treat for MSK cause.   Ultrasound negative for DVT.  X-ray viewed and independently interpreted by me, no fracture or dislocation.  No obvious knee effusion.  Exam  is not consistent with septic joint.  Ultrasound does show a Baker's cyst, this could be contributing to patient's pain and swelling.  Discussed findings with patient.  Discussed treatment with anti-inflammatories, ice, and follow-up with Ortho.  At this time, patient appears safe for discharge.  Return precautions given.  Patient states she understands and agrees to plan.  Final Clinical Impression(s) / ED Diagnoses Final diagnoses:  Acute pain of left knee  Baker's cyst of knee, left    Rx / DC Orders ED Discharge Orders          Ordered    naproxen (NAPROSYN) 250 MG tablet  2 times daily with meals        06/26/21 1331             [place], Aleese Kamps, PA-C 06/26/21 1333    Long, Arlyss Repress, MD 06/27/21 [phone]

## 2021-06-26 NOTE — ED Triage Notes (Signed)
Pt arrives pov with c/o L posterior knee pain x 2 days, denies recent travel or injury, denies fever. Tylenol 1 g at 0700 today with no relief

## 2021-06-26 NOTE — ED Notes (Signed)
Knee sleeve with hinge applied to left knee.

## 2021-09-14 ENCOUNTER — Encounter (HOSPITAL_BASED_OUTPATIENT_CLINIC_OR_DEPARTMENT_OTHER): Payer: Self-pay

## 2021-09-14 ENCOUNTER — Emergency Department (HOSPITAL_BASED_OUTPATIENT_CLINIC_OR_DEPARTMENT_OTHER)
Admission: EM | Admit: 2021-09-14 | Discharge: 2021-09-14 | Disposition: A | Discharge status: Home / Self Care | Payer: BLUE CROSS/BLUE SHIELD | Attending: Emergency Medicine | Admitting: Emergency Medicine

## 2021-09-14 ENCOUNTER — Other Ambulatory Visit: Payer: Self-pay

## 2021-09-14 DIAGNOSIS — Z79899 Other long term (current) drug therapy: Secondary | ICD-10-CM | POA: Insufficient documentation

## 2021-09-14 DIAGNOSIS — Z20822 Contact with and (suspected) exposure to covid-19: Secondary | ICD-10-CM | POA: Diagnosis not present

## 2021-09-14 DIAGNOSIS — I10 Essential (primary) hypertension: Secondary | ICD-10-CM | POA: Insufficient documentation

## 2021-09-14 DIAGNOSIS — J069 Acute upper respiratory infection, unspecified: Secondary | ICD-10-CM | POA: Insufficient documentation

## 2021-09-14 DIAGNOSIS — R059 Cough, unspecified: Secondary | ICD-10-CM | POA: Diagnosis present

## 2021-09-14 DIAGNOSIS — J45909 Unspecified asthma, uncomplicated: Secondary | ICD-10-CM | POA: Insufficient documentation

## 2021-09-14 LAB — RESP PANEL BY RT-PCR (FLU A&B, COVID) ARPGX2
Influenza A by PCR: POSITIVE — AB
Influenza B by PCR: NEGATIVE
SARS Coronavirus 2 by RT PCR: NEGATIVE

## 2021-09-14 MED ORDER — DEXAMETHASONE 4 MG PO TABS
10.0000 mg | ORAL_TABLET | Freq: Once | ORAL | Status: AC
  Administered 2021-09-14: 10 mg via ORAL
  Filled 2021-09-14: qty 3

## 2021-09-14 NOTE — ED Provider Notes (Signed)
[facility] EMERGENCY DEPARTMENT Provider Note   CSN: [id number] Arrival date & time: 09/14/21  0856     History Chief Complaint  Patient presents with   Cough    Veronica Braun is a 55 y.o. female.  The history is provided by the patient.  Cough Cough characteristics:  Non-productive Sputum characteristics:  Nondescript Severity:  Moderate Onset quality:  Gradual Duration:  2 days Timing:  Intermittent Progression:  Waxing and waning Chronicity:  New Context: sick contacts   Relieved by:  Nothing Worsened by:  Nothing Associated symptoms: sinus congestion   Associated symptoms: no chest pain, no chills, no diaphoresis, no ear fullness, no ear pain, no eye discharge, no fever, no headaches, no myalgias, no rash, no rhinorrhea, no shortness of breath, no sore throat, no weight loss and no wheezing       Past Medical History:  Diagnosis Date   Asthma    DDD (degenerative disc disease)    Hypertension    Migraine    Vertigo     Patient Active Problem List   Diagnosis Date Noted   Mild intermittent asthma without complication 10/13/2017   Hot flashes 10/13/2017   Migraine 12/29/2010   Essential hypertension 12/29/2010    Past Surgical History:  Procedure Laterality Date   ABDOMINAL HYSTERECTOMY     APPENDECTOMY     CHOLECYSTECTOMY       OB History   No obstetric history on file.     Family History  Problem Relation Age of Onset   Hypertension Mother    Diabetes Father    Hypertension Father    Heart attack Neg Hx     Social History   Tobacco Use   Smoking status: Never   Smokeless tobacco: Never  Vaping Use   Vaping Use: Never used  Substance Use Topics   Alcohol use: Yes    Comment: occasional   Drug use: No    Home Medications Prior to Admission medications   Medication Sig Start Date End Date Taking? Authorizing Provider  albuterol (PROVENTIL HFA;VENTOLIN HFA) 108 (90 Base) MCG/ACT inhaler Inhale 2 puffs into the lungs  every 4 (four) hours as needed for wheezing. 10/13/17 09/06/23  Marcine Matar, MD  amoxicillin (AMOXIL) 500 MG capsule Take 1 capsule (500 mg total) by mouth 3 (three) times daily. 11/11/18   Gilda Crease, MD  b complex vitamins tablet Take 1 tablet by mouth daily.    [provider]  benzonatate (TESSALON) 100 MG capsule Take 1 capsule (100 mg total) by mouth every 8 (eight) hours. 11/17/20   Sabino Donovan, MD  clindamycin (CLEOCIN) 300 MG capsule Take 1 capsule (300 mg total) by mouth 3 (three) times daily. 09/19/18   Azalia Bilis, MD  estradiol (CLIMARA) 0.075 mg/24hr patch Place 1 patch (0.075 mg total) onto the skin once a week. 10/13/17   Marcine Matar, MD  ibuprofen (ADVIL,MOTRIN) 800 MG tablet Take 1 tablet (800 mg total) by mouth every 6 (six) hours as needed for moderate pain. 11/11/18   Gilda Crease, MD  lisinopril-hydrochlorothiazide (PRINZIDE,ZESTORETIC) 10-12.5 MG tablet Take 1 tablet by mouth daily. 12/25/17   Marcine Matar, MD  meclizine (ANTIVERT) 25 MG tablet Take by mouth. 05/14/14   [provider]  Multiple Vitamin (MULITIVITAMIN WITH MINERALS) TABS Take 1 tablet by mouth daily.    [provider]    Allergies    Patient has no known allergies.  Review of Systems  Review of Systems  Constitutional:  Negative for chills, diaphoresis, fever and weight loss.  HENT:  Negative for ear pain, rhinorrhea and sore throat.   Eyes:  Negative for discharge.  Respiratory:  Positive for cough. Negative for shortness of breath and wheezing.   Cardiovascular:  Negative for chest pain.  Musculoskeletal:  Negative for myalgias.  Skin:  Negative for rash.  Neurological:  Negative for headaches.   Physical Exam Updated Vital Signs BP (!) 157/94 (BP Location: Right Arm)   Pulse 86   Temp 98.6 F (37 C) (Oral)   Resp 18   Ht 5\' 2"  (1.575 m)   Wt 79.4 kg   SpO2 97%   BMI 32.01 kg/m   Physical Exam Vitals and nursing note  reviewed.  Constitutional:      General: She is not in acute distress.    Appearance: She is well-developed.  HENT:     Head: Normocephalic and atraumatic.     Nose: Nose normal.  Eyes:     Extraocular Movements: Extraocular movements intact.     Conjunctiva/sclera: Conjunctivae normal.     Pupils: Pupils are equal, round, and reactive to light.  Cardiovascular:     Rate and Rhythm: Normal rate and regular rhythm.     Pulses: Normal pulses.     Heart sounds: No murmur heard. Pulmonary:     Effort: Pulmonary effort is normal. No respiratory distress.     Breath sounds: Normal breath sounds.  Abdominal:     Palpations: Abdomen is soft.     Tenderness: There is no abdominal tenderness.  Musculoskeletal:     Cervical back: Neck supple.  Skin:    General: Skin is warm and dry.  Neurological:     Mental Status: She is alert.    ED Results / Procedures / Treatments   Labs (all labs ordered are listed, but only abnormal results are displayed) Labs Reviewed  RESP PANEL BY RT-PCR (FLU A&B, COVID) [id number]    EKG EKG Interpretation  Date/Time:  Tuesday September 14 2021 09:16:15 EDT Ventricular Rate:  85 PR Interval:  167 QRS Duration: 82 QT Interval:  367 QTC Calculation: 437 R Axis:   47 Text Interpretation: Sinus rhythm Confirmed by [id number], Hayley Horn ([id number]) on 09/14/2021 9:19:03 AM  Radiology No results found.  Procedures Procedures   Medications Ordered in ED Medications  dexamethasone (DECADRON) tablet 10 mg (has no administration in time range)    ED Course  I have reviewed the triage vital signs and the nursing notes.  Pertinent labs & imaging results that were available during my care of the patient were reviewed by me and considered in my medical decision making (see chart for details).    MDM Rules/Calculators/A&P                           Veronica Braun is here with cough.  Unremarkable vitals.  No fever.  Sick contact with a family member with viral type  symptoms.  Patient with history of asthma.  No wheezing on exam.  Overall appears comfortable.  Reassuring vitals.  Suspect viral process.  May be some mild reactive airway disease.  Given a dose of Decadron.  No concern for pneumonia or other acute infectious process.  Recommend continued use of albuterol at home.  Viral swab has been obtained.  Discharged in good condition.  Understands return precautions.  This chart was dictated using voice recognition software.  Despite best  efforts to proofread,  errors can occur which can change the documentation meaning.   Final Clinical Impression(s) / ED Diagnoses Final diagnoses:  Viral URI with cough    Rx / DC Orders ED Discharge Orders     None        Virgina Norfolk, DO 09/14/21 [id number]

## 2021-09-14 NOTE — ED Triage Notes (Addendum)
C/o non-productive cough/sore throat since Sunday. C/o chest pressure, worse with coughing, some nausea. NAD during triage  Hx of asthma, hasnt taken any inhalers

## 2021-09-14 NOTE — ED Notes (Addendum)
Pt reports CP when coughing, denies fever. Pt endorses having inhaler, has not used.

## 2021-09-14 NOTE — ED Notes (Signed)
Pt discharged to home. Discharge instructions have been discussed with patient and/or family members. Pt verbally acknowledges understanding d/c instructions, and endorses comprehension to checkout at registration before leaving.  °

## 2021-11-16 ENCOUNTER — Other Ambulatory Visit: Payer: Self-pay

## 2021-11-16 ENCOUNTER — Emergency Department (HOSPITAL_BASED_OUTPATIENT_CLINIC_OR_DEPARTMENT_OTHER): Payer: BLUE CROSS/BLUE SHIELD

## 2021-11-16 ENCOUNTER — Emergency Department (HOSPITAL_BASED_OUTPATIENT_CLINIC_OR_DEPARTMENT_OTHER)
Admission: EM | Admit: 2021-11-16 | Discharge: 2021-11-16 | Disposition: A | Discharge status: Home / Self Care | Payer: BLUE CROSS/BLUE SHIELD | Attending: Emergency Medicine | Admitting: Emergency Medicine

## 2021-11-16 DIAGNOSIS — M25562 Pain in left knee: Secondary | ICD-10-CM | POA: Insufficient documentation

## 2021-11-16 MED ORDER — KETOROLAC TROMETHAMINE 60 MG/2ML IM SOLN
60.0000 mg | Freq: Once | INTRAMUSCULAR | Status: AC
Start: 2021-11-16 — End: 2021-11-16
  Administered 2021-11-16: 60 mg via INTRAMUSCULAR
  Filled 2021-11-16: qty 2

## 2021-11-16 NOTE — ED Triage Notes (Signed)
Pt came in with L knee pain that started last night at 1800. Pt states that [place] she [place]". Noted L knee swelling. Pt states she took Tylenol last night

## 2021-11-16 NOTE — Discharge Instructions (Signed)
You may take acetaminophen (Tylenol) up to 1000 mg 4 times a day for 1 week. This is the maximum dose of Tylenol usually take from all sources. Please check other over-the-counter medications and prescriptions to ensure you are not taking other medications that contain acetaminophen.  You may also take ibuprofen 400 mg 6 times a day alternating with or at the same time as tylenol OR ibuprofen 600mg  four times a day.  Follow up with your Orthopedic physician. You may have a problem with your meniscus, other internal knee problem or pes anserine bursitis. Recommend activity as tolerated, ice, elevation, lidocaine patch in addition to the acetaminophen and ibuprofen.  Follow up with your Orthopedic provider.

## 2021-11-16 NOTE — ED Provider Notes (Signed)
[facility] EMERGENCY DEPARTMENT Provider Note   CSN: [id number] Arrival date & time: 11/16/21  2585     History  Chief Complaint  Patient presents with   Knee Pain    Veronica Braun is a 56 y.o. female.  HPI     56 year old female with history below presents with concern for left knee pain.  Reports that [place], and around 6 PM developed pain of her left knee.  Reports it progressively worsened, and this morning around 3 AM she was not able to walk to the bathroom because the pain was so severe.  Reports that it took her a very long time to get there with pain worsening with flexion of her knee as well as bearing weight.  She had taken some Tylenol and used icy hot.  Denies fevers, history of joint infection, history of IV drug use.  Denies specific injury to the knee.  Home Medications Prior to Admission medications   Medication Sig Start Date End Date Taking? Authorizing Provider  albuterol (PROVENTIL HFA;VENTOLIN HFA) 108 (90 Base) MCG/ACT inhaler Inhale 2 puffs into the lungs every 4 (four) hours as needed for wheezing. 10/13/17 09/06/23  Marcine Matar, MD  amoxicillin (AMOXIL) 500 MG capsule Take 1 capsule (500 mg total) by mouth 3 (three) times daily. 11/11/18   Gilda Crease, MD  b complex vitamins tablet Take 1 tablet by mouth daily.    [provider]  benzonatate (TESSALON) 100 MG capsule Take 1 capsule (100 mg total) by mouth every 8 (eight) hours. 11/17/20   Sabino Donovan, MD  clindamycin (CLEOCIN) 300 MG capsule Take 1 capsule (300 mg total) by mouth 3 (three) times daily. 09/19/18   Azalia Bilis, MD  estradiol (CLIMARA) 0.075 mg/24hr patch Place 1 patch (0.075 mg total) onto the skin once a week. 10/13/17   Marcine Matar, MD  ibuprofen (ADVIL,MOTRIN) 800 MG tablet Take 1 tablet (800 mg total) by mouth every 6 (six) hours as needed for moderate pain. 11/11/18   Gilda Crease, MD   lisinopril-hydrochlorothiazide (PRINZIDE,ZESTORETIC) 10-12.5 MG tablet Take 1 tablet by mouth daily. 12/25/17   Marcine Matar, MD  meclizine (ANTIVERT) 25 MG tablet Take by mouth. 05/14/14   [provider]  Multiple Vitamin (MULITIVITAMIN WITH MINERALS) TABS Take 1 tablet by mouth daily.    [provider]      Allergies    Patient has no known allergies.    Review of Systems   Review of Systems  Constitutional:  Negative for fever.  Respiratory:  Negative for cough.   Cardiovascular:  Negative for chest pain.  Musculoskeletal:  Positive for arthralgias and gait problem.  Skin:  Negative for wound.   Physical Exam Updated Vital Signs BP (!) 142/75    Pulse 93    Temp 98.1 F (36.7 C)    Resp 20    Wt 81.6 kg    SpO2 97%    BMI 32.92 kg/m  Physical Exam Vitals and nursing note reviewed.  Constitutional:      General: She is not in acute distress.    Appearance: Normal appearance. She is not ill-appearing, toxic-appearing or diaphoretic.  HENT:     Head: Normocephalic.  Eyes:     Conjunctiva/sclera: Conjunctivae normal.  Cardiovascular:     Rate and Rhythm: Normal rate and regular rhythm.     Pulses: Normal pulses.  Pulmonary:     Effort: Pulmonary effort  is normal. No respiratory distress.  Musculoskeletal:        General: Tenderness (pes anserine bursa, medial knee) present. No deformity or signs of injury.     Cervical back: No rigidity.     Comments: Mild swelling to left knee Painful ROM with both passive and active ROM, able to flex and extend  Skin:    General: Skin is warm and dry.     Coloration: Skin is not jaundiced or pale.  Neurological:     General: No focal deficit present.     Mental Status: She is alert and oriented to person, place, and time.    ED Results / Procedures / Treatments   Labs (all labs ordered are listed, but only abnormal results are displayed) Labs Reviewed - No data to display  EKG None  Radiology DG Knee  Complete 4 Views Left  Result Date: 11/16/2021 CLINICAL DATA:  Left knee pain and swelling. EXAM: LEFT KNEE - COMPLETE 4+ VIEW COMPARISON:  None. FINDINGS: No evidence of fracture, dislocation, or joint effusion. No evidence of arthropathy or other focal bone abnormality. Soft tissues are unremarkable. IMPRESSION: Negative. Electronically Signed   By: Kennith Center M.D.   On: 11/16/2021 07:29    Procedures Procedures    Medications Ordered in ED Medications  ketorolac (TORADOL) injection 60 mg (60 mg Intramuscular Given 11/16/21 0806)    ED Course/ Medical Decision Making/ A&P                           Medical Decision Making    56 year old female with history below presents with concern for left knee pain.   She has good pulses bilaterally, no sign of acute arterial thrombus.  Pain is localized to the front of the knee, without any other calf pain or back of the knee pain, no significantly long travel and have low suspicion for DVT.  X-ray was completed which shows no evidence of fracture, dislocation or joint effusion.  Do not suspect septic arthritis given no fever, risk factors, no erythema, fairly good range of motion, no joint effusion on x-ray that I independently reviewed.  Suspect likely pes anserine bursitis related to running, medial meniscal injury or other internal knee derangement.  Given knee sleeve for comfort, crutches, recommend Tylenol, ibuprofen, lidocaine patch, activity as tolerated and orthopedic follow-up. Patient discharged in stable condition with understanding of reasons to return.         Final Clinical Impression(s) / ED Diagnoses Final diagnoses:  Acute pain of left knee    Rx / DC Orders ED Discharge Orders     None         Alvira Monday, MD 11/16/21 [id number]

## 2022-01-30 IMAGING — CR DG KNEE COMPLETE 4+V*L*
4 series · 4 of 4 positions shown · non-contrast
Comparison: None.

CLINICAL DATA: Left knee pain and swelling.

EXAM:
LEFT KNEE - COMPLETE 4+ VIEW

[t knee ap left]
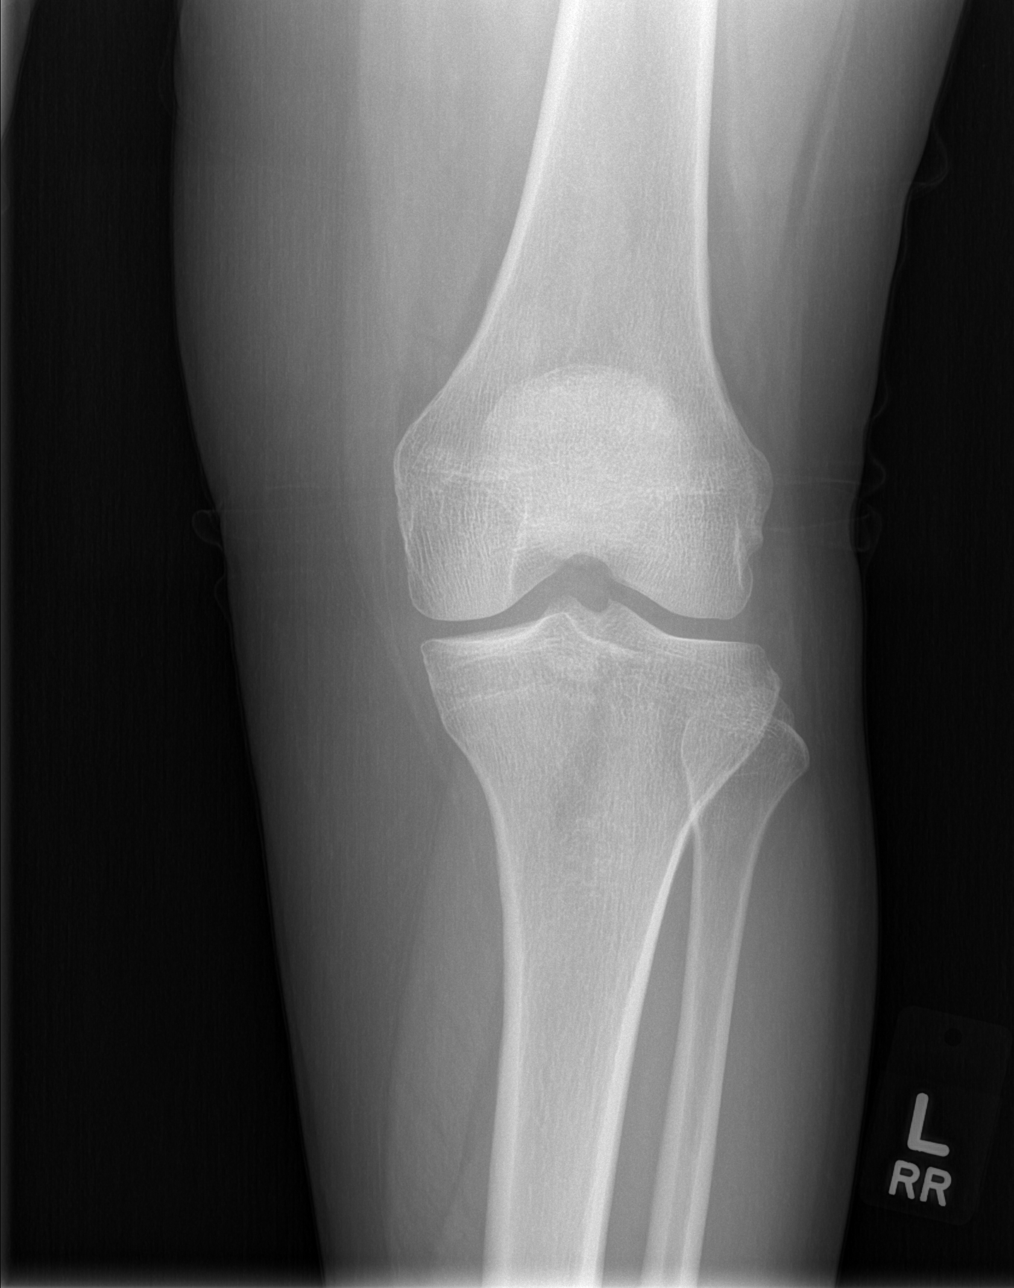

[t knee oblique left (1 of 2)]
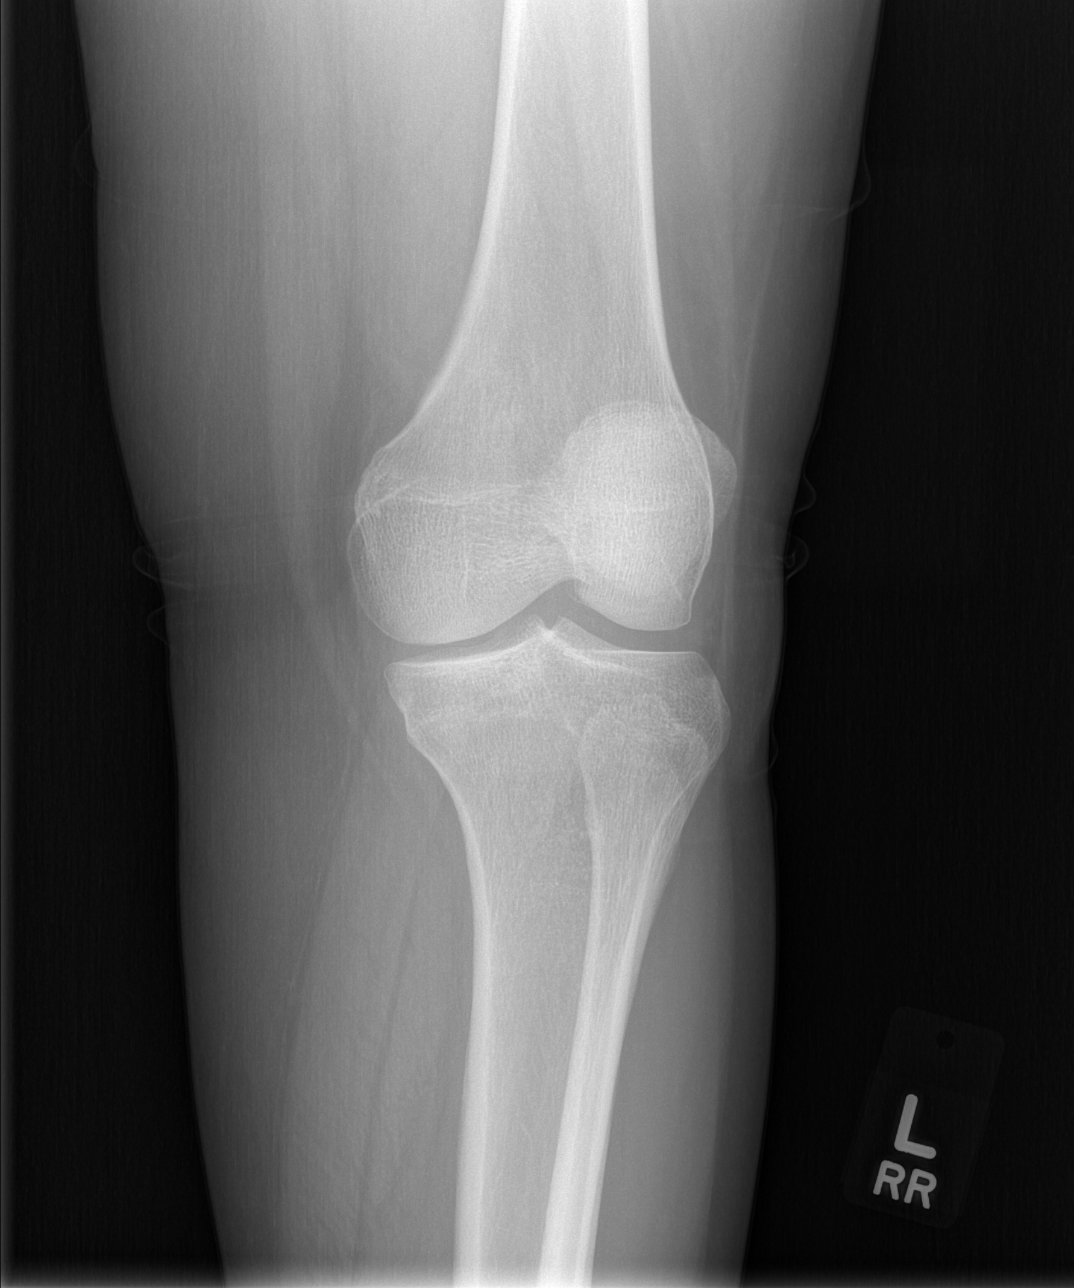

[t knee oblique left (2 of 2)]
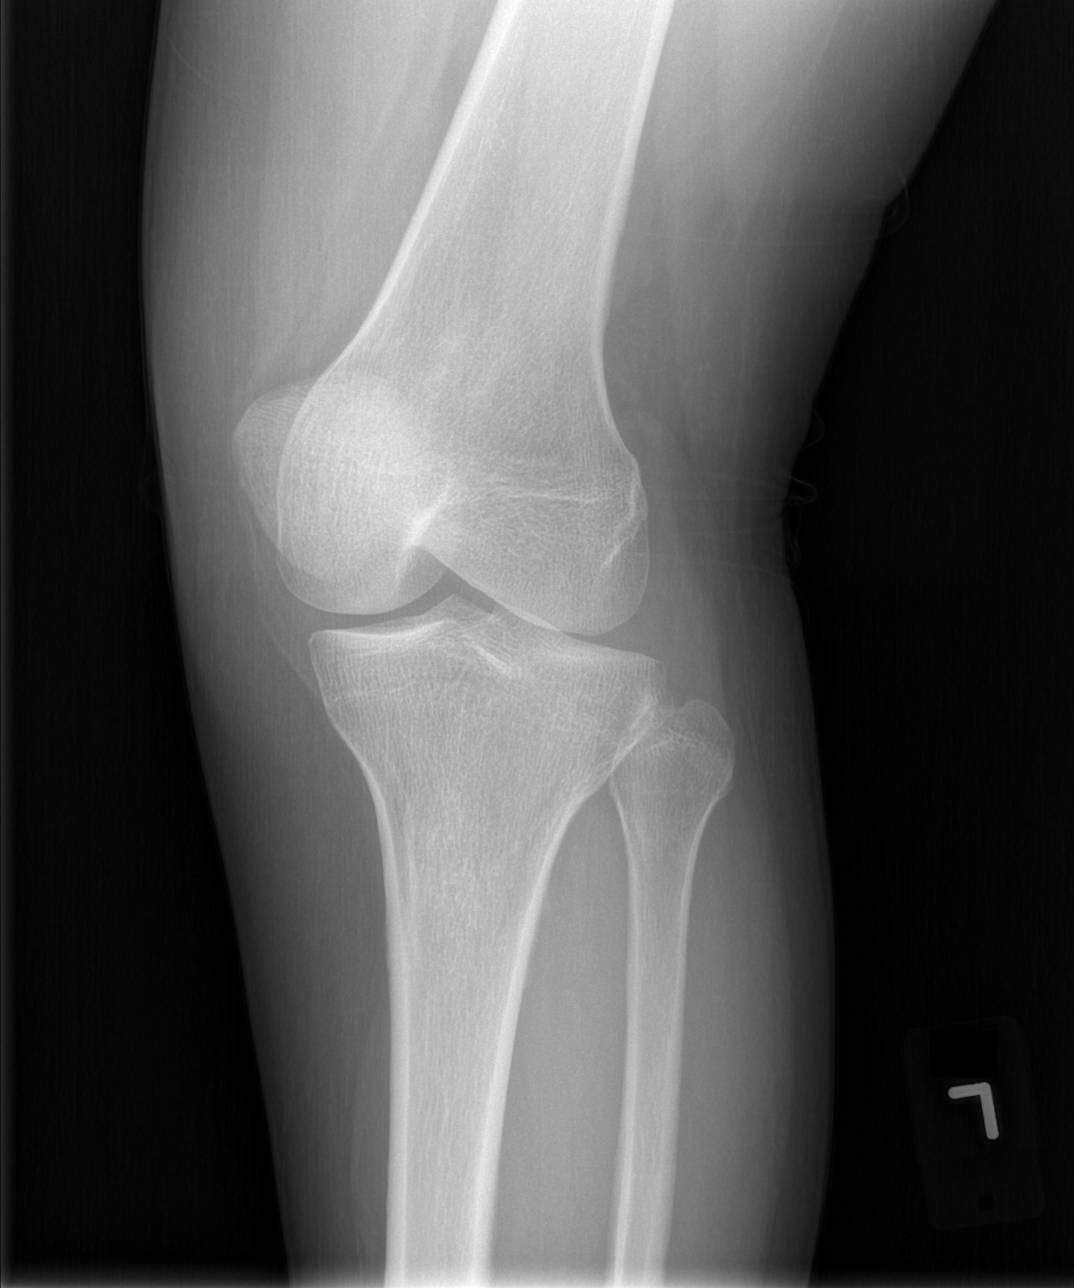

[t knee lat left]
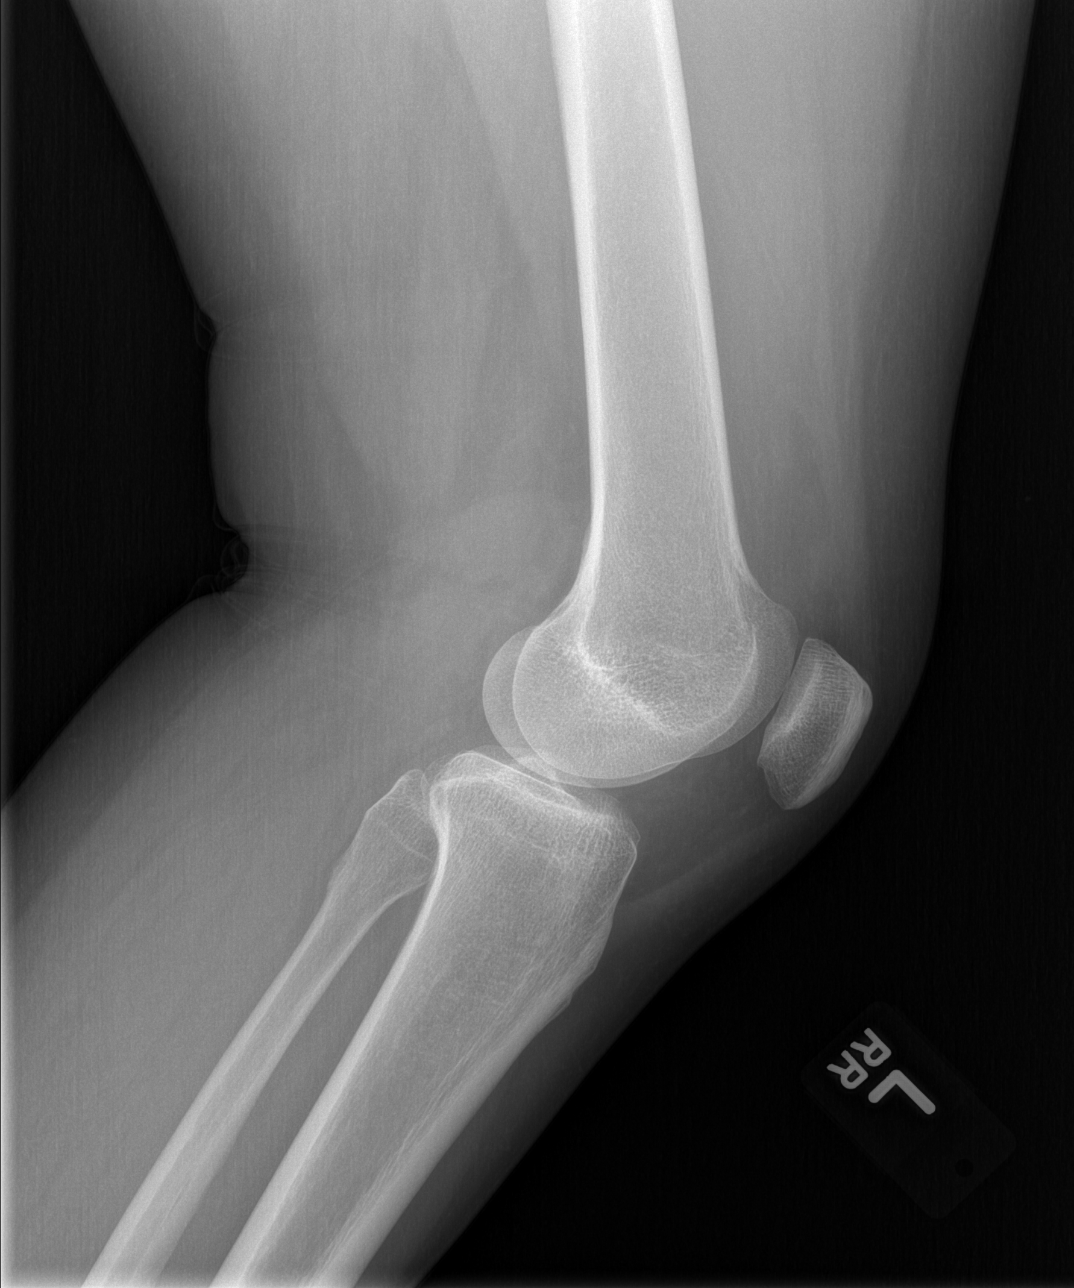

[4 of 4 positions shown; findings below may reference images not displayed]

FINDINGS: No evidence of fracture, dislocation, or joint effusion. No evidence
of arthropathy or other focal bone abnormality. Soft tissues are
unremarkable.
IMPRESSION: Negative.

## 2022-08-16 ENCOUNTER — Emergency Department (HOSPITAL_BASED_OUTPATIENT_CLINIC_OR_DEPARTMENT_OTHER): Payer: BLUE CROSS/BLUE SHIELD

## 2022-08-16 ENCOUNTER — Other Ambulatory Visit: Payer: Self-pay

## 2022-08-16 ENCOUNTER — Emergency Department (HOSPITAL_BASED_OUTPATIENT_CLINIC_OR_DEPARTMENT_OTHER)
Admission: EM | Admit: 2022-08-16 | Discharge: 2022-08-16 | Discharge status: Against Medical Advice | Payer: BLUE CROSS/BLUE SHIELD | Attending: Emergency Medicine | Admitting: Emergency Medicine

## 2022-08-16 ENCOUNTER — Encounter (HOSPITAL_BASED_OUTPATIENT_CLINIC_OR_DEPARTMENT_OTHER): Payer: Self-pay

## 2022-08-16 DIAGNOSIS — R519 Headache, unspecified: Secondary | ICD-10-CM | POA: Diagnosis present

## 2022-08-16 DIAGNOSIS — R110 Nausea: Secondary | ICD-10-CM | POA: Insufficient documentation

## 2022-08-16 DIAGNOSIS — R079 Chest pain, unspecified: Secondary | ICD-10-CM

## 2022-08-16 DIAGNOSIS — R0789 Other chest pain: Secondary | ICD-10-CM | POA: Diagnosis not present

## 2022-08-16 DIAGNOSIS — Z5321 Procedure and treatment not carried out due to patient leaving prior to being seen by health care provider: Secondary | ICD-10-CM | POA: Diagnosis not present

## 2022-08-16 LAB — CBC
HCT: 39.5 % (ref 36.0–46.0)
Hemoglobin: 13 g/dL (ref 12.0–15.0)
MCH: 28.8 pg (ref 26.0–34.0)
MCHC: 32.9 g/dL (ref 30.0–36.0)
MCV: 87.4 fL (ref 80.0–100.0)
Platelets: 288 10*3/uL (ref 150–400)
RBC: 4.52 MIL/uL (ref 3.87–5.11)
RDW: 13.2 % (ref 11.5–15.5)
WBC: 6.3 10*3/uL (ref 4.0–10.5)
nRBC: 0 % (ref 0.0–0.2)

## 2022-08-16 LAB — TROPONIN I (HIGH SENSITIVITY): Troponin I (High Sensitivity): 3 ng/L (ref <18)

## 2022-08-16 LAB — BASIC METABOLIC PANEL
Anion gap: 7 (ref 5–15)
BUN: 14 mg/dL (ref 6–20)
CO2: 27 mmol/L (ref 22–32)
Calcium: 8.9 mg/dL (ref 8.9–10.3)
Chloride: 103 mmol/L (ref 98–111)
Creatinine, Ser: 0.85 mg/dL (ref 0.44–1.00)
GFR, Estimated: >60 mL/min (ref >60)
Glucose, Bld: 103 mg/dL — ABNORMAL HIGH (ref 70–99)
Potassium: 3.4 mmol/L — ABNORMAL LOW (ref 3.5–5.1)
Sodium: 137 mmol/L (ref 135–145)

## 2022-08-16 MED ORDER — ONDANSETRON HCL 4 MG/2ML IJ SOLN: 4.0000 mg | Freq: Once | INTRAMUSCULAR | Status: DC

## 2022-08-16 MED ORDER — MORPHINE SULFATE (PF) 4 MG/ML IV SOLN
4.0000 mg | Freq: Once | INTRAVENOUS | Status: DC
Start: 2022-08-16 — End: 2022-08-16

## 2022-08-16 NOTE — ED Triage Notes (Signed)
C/o frontal headache that began today and has hx of migraines. Also reports CP that is accompanied with burping. Pt has sharp CP while at rest. Pt had nausea a day ago; denies other symptoms. No cardiac hx.

## 2022-08-18 NOTE — ED Provider Notes (Signed)
Pt ws triaged and left without being seen. Labs including troponin, CXR results, ECG reviewed without significant abnormalities.    Gareth Morgan, MD 08/18/22 1125

## 2023-07-30 ENCOUNTER — Encounter (HOSPITAL_BASED_OUTPATIENT_CLINIC_OR_DEPARTMENT_OTHER): Payer: Self-pay | Admitting: Urology

## 2023-07-30 ENCOUNTER — Emergency Department (HOSPITAL_BASED_OUTPATIENT_CLINIC_OR_DEPARTMENT_OTHER): Payer: Self-pay

## 2023-07-30 ENCOUNTER — Emergency Department (HOSPITAL_BASED_OUTPATIENT_CLINIC_OR_DEPARTMENT_OTHER)
Admission: EM | Admit: 2023-07-30 | Discharge: 2023-07-30 | Disposition: A | Discharge status: Home / Self Care | Payer: Self-pay | Attending: Emergency Medicine | Admitting: Emergency Medicine

## 2023-07-30 ENCOUNTER — Other Ambulatory Visit: Payer: Self-pay

## 2023-07-30 DIAGNOSIS — M792 Neuralgia and neuritis, unspecified: Secondary | ICD-10-CM | POA: Insufficient documentation

## 2023-07-30 DIAGNOSIS — M79602 Pain in left arm: Secondary | ICD-10-CM | POA: Insufficient documentation

## 2023-07-30 LAB — CBC
HCT: 40.3 % (ref 36.0–46.0)
Hemoglobin: 13 g/dL (ref 12.0–15.0)
MCH: 28.6 pg (ref 26.0–34.0)
MCHC: 32.3 g/dL (ref 30.0–36.0)
MCV: 88.6 fL (ref 80.0–100.0)
Platelets: 282 10*3/uL (ref 150–400)
RBC: 4.55 MIL/uL (ref 3.87–5.11)
RDW: 13.3 % (ref 11.5–15.5)
WBC: 6.3 10*3/uL (ref 4.0–10.5)
nRBC: 0 % (ref 0.0–0.2)

## 2023-07-30 LAB — BASIC METABOLIC PANEL
Anion gap: 11 (ref 5–15)
BUN: 15 mg/dL (ref 6–20)
CO2: 27 mmol/L (ref 22–32)
Calcium: 9 mg/dL (ref 8.9–10.3)
Chloride: 99 mmol/L (ref 98–111)
Creatinine, Ser: 0.72 mg/dL (ref 0.44–1.00)
GFR, Estimated: >60 mL/min (ref >60)
Glucose, Bld: 77 mg/dL (ref 70–99)
Potassium: 3.5 mmol/L (ref 3.5–5.1)
Sodium: 137 mmol/L (ref 135–145)

## 2023-07-30 LAB — TROPONIN I (HIGH SENSITIVITY): Troponin I (High Sensitivity): 2 ng/L (ref <18)

## 2023-07-30 MED ORDER — HYDROCODONE-ACETAMINOPHEN 5-325 MG PO TABS
1.0000 | ORAL_TABLET | Freq: Four times a day (QID) | ORAL | 0 refills | Status: DC | PRN
Start: 2023-07-30 — End: 2023-07-30

## 2023-07-30 MED ORDER — KETOROLAC TROMETHAMINE 30 MG/ML IJ SOLN
30.0000 mg | Freq: Once | INTRAMUSCULAR | Status: AC
  Administered 2023-07-30: 30 mg via INTRAVENOUS
  Filled 2023-07-30: qty 1

## 2023-07-30 MED ORDER — METHYLPREDNISOLONE 4 MG PO TBPK: ORAL_TABLET | ORAL | 0 refills | Status: AC

## 2023-07-30 MED ORDER — METHYLPREDNISOLONE 4 MG PO TBPK: ORAL_TABLET | ORAL | 0 refills | Status: DC

## 2023-07-30 MED ORDER — OXYCODONE HCL 5 MG PO TABS
5.0000 mg | ORAL_TABLET | Freq: Four times a day (QID) | ORAL | 0 refills | Status: AC | PRN
Start: 2023-07-30 — End: ?

## 2023-07-30 NOTE — ED Provider Notes (Signed)
4:59 PM Patient called the ED after discharge in regards to pain medication that was prescribed by her provider earlier today.  [facility] does not have hydrocodone/acetaminophen in stock.  Reviewed patient records, prescription and controlled substance database.  I have sent in # 15 tablets oxycodone 5mg  as a substitute.    Renne Crigler, PA-C 07/30/23 1700    Arby Barrette, MD 08/02/23 1122

## 2023-07-30 NOTE — ED Provider Notes (Signed)
[facility] AT [facility] Provider Note   CSN: [id number] Arrival date & time: 07/30/23  1231     History  Chief Complaint  Patient presents with   Arm Pain    Veronica Braun is a 57 y.o. female.  HPI Patient reports she started getting left arm pain this morning.  She has had other musculoskeletal pains more on the right and at first thought it is probably muscle spasms.  She tried muscle relaxers.  She reports that the pain however has gotten worse over the course of the day.  She feels pain in her shoulder and it radiates down the arm and into the forearm.  She is not experiencing numbness or dysfunction of the arm but pain that is also somewhat in the shoulder and posterior thoracic under the shoulder blade.  Denies any injury.  She denies associated shortness of breath.  Does not feel that she has chest pain, lightheadedness or dizziness. No Lower extremity swelling or calf pain.    Home Medications Prior to Admission medications   Medication Sig Start Date End Date Taking? Authorizing Provider  HYDROcodone-acetaminophen (NORCO/VICODIN) 5-325 MG tablet Take 1-2 tablets by mouth every 6 (six) hours as needed. 07/30/23  Yes Arby Barrette, MD  methylPREDNISolone (MEDROL DOSEPAK) 4 MG TBPK tablet Take per Dosepak instruction 07/30/23  Yes Andres Escandon, Lebron Conners, MD  albuterol (PROVENTIL HFA;VENTOLIN HFA) 108 (90 Base) MCG/ACT inhaler Inhale 2 puffs into the lungs every 4 (four) hours as needed for wheezing. 10/13/17 09/06/23  Marcine Matar, MD  amoxicillin (AMOXIL) 500 MG capsule Take 1 capsule (500 mg total) by mouth 3 (three) times daily. 11/11/18   Gilda Crease, MD  b complex vitamins tablet Take 1 tablet by mouth daily.    [provider]  benzonatate (TESSALON) 100 MG capsule Take 1 capsule (100 mg total) by mouth every 8 (eight) hours. 11/17/20   Sabino Donovan, MD  clindamycin (CLEOCIN) 300 MG capsule Take 1 capsule (300 mg total) by  mouth 3 (three) times daily. 09/19/18   Azalia Bilis, MD  estradiol (CLIMARA) 0.075 mg/24hr patch Place 1 patch (0.075 mg total) onto the skin once a week. 10/13/17   Marcine Matar, MD  ibuprofen (ADVIL,MOTRIN) 800 MG tablet Take 1 tablet (800 mg total) by mouth every 6 (six) hours as needed for moderate pain. 11/11/18   Gilda Crease, MD  lisinopril-hydrochlorothiazide (PRINZIDE,ZESTORETIC) 10-12.5 MG tablet Take 1 tablet by mouth daily. 12/25/17   Marcine Matar, MD  meclizine (ANTIVERT) 25 MG tablet Take by mouth. 05/14/14   [provider]  Multiple Vitamin (MULITIVITAMIN WITH MINERALS) TABS Take 1 tablet by mouth daily.    [provider]      Allergies    Patient has no known allergies.    Review of Systems   Review of Systems  Physical Exam Updated Vital Signs BP 132/83 (BP Location: Right Arm)   Pulse (!) 102   Temp 98.2 F (36.8 C) (Oral)   Resp 18   Ht 5\' 2"  (1.575 m)   Wt 81.6 kg   SpO2 98%   BMI 32.90 kg/m  Physical Exam Constitutional:      Comments: Nontoxic clinically well in appearance.  Well-nourished well-developed.  HENT:     Mouth/Throat:     Pharynx: Oropharynx is clear.  Eyes:     Extraocular Movements: Extraocular movements intact.  Neck:     Comments: No neck stiffness.  No reproducible bony point  tenderness.  No soft tissue swelling or lymphadenopathy. Cardiovascular:     Rate and Rhythm: Normal rate and regular rhythm.  Pulmonary:     Effort: Pulmonary effort is normal.     Breath sounds: Normal breath sounds.  Abdominal:     General: There is no distension.     Palpations: Abdomen is soft.     Tenderness: There is no abdominal tenderness. There is no guarding.  Musculoskeletal:     Cervical back: Neck supple.     Comments: Patient endorses reproducible pain in the paraspinous muscle bodies at about C7-T3.  Also pain along the scapular margin and subscapularis.  Deep palpation in these areas did reproduce pain  radiating into the left arm.  Patient does have intact range of motion we can perform rotation of the shoulder without any locking.  This does somewhat however exacerbate pain in the arm.  Radial pulse 2+ and strong.  Hand is warm and dry with brisk cap refill.  No edema of the arm or hand.  No soft tissue abnormalities.  Good grip strength.  Lower extremities no peripheral edema and no calf pain to compression  Skin:    General: Skin is warm and dry.  Neurological:     General: No focal deficit present.     Mental Status: She is oriented to person, place, and time.     Motor: No weakness.     Coordination: Coordination normal.  Psychiatric:        Mood and Affect: Mood normal.     ED Results / Procedures / Treatments   Labs (all labs ordered are listed, but only abnormal results are displayed) Labs Reviewed  BASIC METABOLIC PANEL  CBC  TROPONIN I (HIGH SENSITIVITY)  TROPONIN I (HIGH SENSITIVITY)    EKG EKG Interpretation Date/Time:  Sunday July 30 2023 15:38:10 EDT Ventricular Rate:  81 PR Interval:  192 QRS Duration:  96 QT Interval:  378 QTC Calculation: 439 R Axis:   52  Text Interpretation: Sinus rhythm normal, no sig change from previous Confirmed by Arby Barrette [phone]) on 07/30/2023 3:47:12 PM  Radiology DG Chest 2 View  Result Date: 07/30/2023 CLINICAL DATA:  Left arm and thoracic back pain. EXAM: CHEST - 2 VIEW COMPARISON:  Chest radiograph dated 08/16/2022. FINDINGS: The heart size and mediastinal contours are within normal limits. Both lungs are clear. The visualized skeletal structures are unremarkable. IMPRESSION: No active cardiopulmonary disease. Electronically Signed   By: Romona Curls M.D.   On: 07/30/2023 14:10    Procedures Procedures    Medications Ordered in ED Medications  ketorolac (TORADOL) 30 MG/ML injection 30 mg (30 mg Intravenous Given 07/30/23 1422)    ED Course/ Medical Decision Making/ A&P                                  Medical Decision Making  Presents as outlined with left arm pain.  No specific injury.  She has had history of some musculoskeletal pain in the right upper extremity.  Patient has some thoracic pain this is also fairly reproducible.  With combination of aching left arm pain and history of hypertension will get ACS workup.  Plan for EKG, labs, chest x-ray.  Will treat for pain with Toradol.  EKG reviewed by myself no ischemic appearance no change from previous.  Troponin normal.  Basic metabolic panel and CBC normal.  Two-view chest x-ray reviewed by radiology  no acute findings.  At this time with diagnostic evaluation reassuring and the patient's pain presentation most suggestive of musculoskeletal pain, will plan for discharge.  I suspect the patient has a cervical radiculopathy or other compressive type neuralgia stemming from the upper thoracic back or subscapularis region.  She is neurovascularly intact.  At this time we will start treatment with Medrol Dosepak and Vicodin as needed until patient can transition to regular Tylenol.  Patient voices understanding.  Return precautions reviewed.        Final Clinical Impression(s) / ED Diagnoses Final diagnoses:  Left arm pain  Neuralgia    Rx / DC Orders ED Discharge Orders          Ordered    methylPREDNISolone (MEDROL DOSEPAK) 4 MG TBPK tablet        07/30/23 1543    HYDROcodone-acetaminophen (NORCO/VICODIN) 5-325 MG tablet  Every 6 hours PRN        07/30/23 1543              Arby Barrette, MD 07/30/23 1550

## 2023-07-30 NOTE — ED Triage Notes (Signed)
Pt states left arm pain that started this am at 0400, pain starts in the shoulder and radiates down  Denies any chest pain  Denies and injury  Worse with sitting still or laying down

## 2023-07-30 NOTE — Discharge Instructions (Addendum)
1.  I suspect you have a nerve pinched either in your neck or back. 2.  Start taking the Medrol Dosepak as prescribed.  This is a steroid to help with inflammation. 3.  You may take 1-2 Vicodin tablets as needed for severe pain.  As your pain improves, try to take less Vicodin and take only extra strength Tylenol. 4.  You may use over-the-counter pain patches such as Salonpas over the area in your upper back.  Also warm moist heat may be helpful. 5.  Schedule follow-up with your doctor soon as possible to see how you are symptoms are improving or not.  You may need further referral for other tests and evaluation. 6.  Return to emergency department immediately if you have sudden severe neck pain, weakness of the arm or other concerning changes.
